# Patient Record
Sex: Male | Born: 1950 | Race: Black or African American | Hispanic: No | Marital: Single | Smoking: Current every day smoker
Health system: Southern US, Community
[De-identification: ages and names within clinical notes are randomized; demographics above are authoritative.]

## PROBLEM LIST (undated history)

## (undated) DIAGNOSIS — I2510 Atherosclerotic heart disease of native coronary artery without angina pectoris: Secondary | ICD-10-CM

## (undated) DIAGNOSIS — I499 Cardiac arrhythmia, unspecified: Secondary | ICD-10-CM

## (undated) DIAGNOSIS — N189 Chronic kidney disease, unspecified: Secondary | ICD-10-CM

## (undated) DIAGNOSIS — B029 Zoster without complications: Secondary | ICD-10-CM

## (undated) DIAGNOSIS — Z8701 Personal history of pneumonia (recurrent): Secondary | ICD-10-CM

## (undated) DIAGNOSIS — K859 Acute pancreatitis without necrosis or infection, unspecified: Secondary | ICD-10-CM

## (undated) DIAGNOSIS — D649 Anemia, unspecified: Secondary | ICD-10-CM

## (undated) DIAGNOSIS — N2581 Secondary hyperparathyroidism of renal origin: Secondary | ICD-10-CM

## (undated) DIAGNOSIS — K219 Gastro-esophageal reflux disease without esophagitis: Secondary | ICD-10-CM

## (undated) DIAGNOSIS — D696 Thrombocytopenia, unspecified: Secondary | ICD-10-CM

## (undated) DIAGNOSIS — E119 Type 2 diabetes mellitus without complications: Secondary | ICD-10-CM

## (undated) DIAGNOSIS — I10 Essential (primary) hypertension: Secondary | ICD-10-CM

## (undated) DIAGNOSIS — M1990 Unspecified osteoarthritis, unspecified site: Secondary | ICD-10-CM

## (undated) DIAGNOSIS — E785 Hyperlipidemia, unspecified: Secondary | ICD-10-CM

## (undated) DIAGNOSIS — J449 Chronic obstructive pulmonary disease, unspecified: Secondary | ICD-10-CM

## (undated) HISTORY — PX: APPENDECTOMY: SHX54

## (undated) HISTORY — PX: EYE SURGERY: SHX253

## (undated) HISTORY — DX: Acute pancreatitis without necrosis or infection, unspecified: K85.90

## (undated) HISTORY — PX: CORONARY ANGIOPLASTY: SHX604

## (undated) HISTORY — PX: CORONARY ARTERY BYPASS GRAFT: SHX141

## (undated) HISTORY — PX: TRANSPLANTATION RENAL: SUR1385

---

## 2002-02-20 ENCOUNTER — Inpatient Hospital Stay (HOSPITAL_COMMUNITY): Admission: AD | Admit: 2002-02-20 | Discharge: 2002-02-28 | Payer: Self-pay | Admitting: Nephrology

## 2002-02-22 ENCOUNTER — Encounter: Payer: Self-pay | Admitting: Nephrology

## 2002-02-23 ENCOUNTER — Encounter: Payer: Self-pay | Admitting: Nephrology

## 2002-02-24 ENCOUNTER — Encounter: Payer: Self-pay | Admitting: Nephrology

## 2004-09-10 ENCOUNTER — Inpatient Hospital Stay (HOSPITAL_COMMUNITY): Admission: AD | Admit: 2004-09-10 | Discharge: 2004-09-12 | Payer: Self-pay | Admitting: Nephrology

## 2009-12-23 ENCOUNTER — Emergency Department: Payer: Self-pay | Admitting: Emergency Medicine

## 2010-02-09 DIAGNOSIS — I2510 Atherosclerotic heart disease of native coronary artery without angina pectoris: Secondary | ICD-10-CM

## 2010-02-09 HISTORY — DX: Atherosclerotic heart disease of native coronary artery without angina pectoris: I25.10

## 2010-05-12 ENCOUNTER — Emergency Department: Payer: Self-pay | Admitting: Emergency Medicine

## 2010-11-10 ENCOUNTER — Ambulatory Visit: Payer: Self-pay | Admitting: Oncology

## 2010-12-02 ENCOUNTER — Ambulatory Visit: Payer: Self-pay | Admitting: Oncology

## 2011-02-24 ENCOUNTER — Ambulatory Visit: Payer: Self-pay | Admitting: Ophthalmology

## 2011-03-10 ENCOUNTER — Ambulatory Visit: Payer: Self-pay | Admitting: Ophthalmology

## 2011-03-10 LAB — POTASSIUM: Potassium: 4.9 mmol/L (ref 3.5–5.1)

## 2011-04-20 ENCOUNTER — Inpatient Hospital Stay: Payer: Self-pay | Admitting: *Deleted

## 2011-04-20 LAB — COMPREHENSIVE METABOLIC PANEL
Albumin: 3.5 g/dL (ref 3.4–5.0)
Anion Gap: 13 (ref 7–16)
BUN: 20 mg/dL — ABNORMAL HIGH (ref 7–18)
Bilirubin,Total: 0.5 mg/dL (ref 0.2–1.0)
Calcium, Total: 8.6 mg/dL (ref 8.5–10.1)
Chloride: 99 mmol/L (ref 98–107)
Creatinine: 4.22 mg/dL — ABNORMAL HIGH (ref 0.60–1.30)
EGFR (African American): 19 — ABNORMAL LOW
Glucose: 72 mg/dL (ref 65–99)
Osmolality: 281 (ref 275–301)
Potassium: 3.9 mmol/L (ref 3.5–5.1)
Sodium: 140 mmol/L (ref 136–145)
Total Protein: 7.3 g/dL (ref 6.4–8.2)

## 2011-04-20 LAB — CBC
MCH: 31.3 pg (ref 26.0–34.0)
MCHC: 33.1 g/dL (ref 32.0–36.0)
Platelet: 109 10*3/uL — ABNORMAL LOW (ref 150–440)
RBC: 3.33 10*6/uL — ABNORMAL LOW (ref 4.40–5.90)
RDW: 16.7 % — ABNORMAL HIGH (ref 11.5–14.5)

## 2011-04-20 LAB — CK TOTAL AND CKMB (NOT AT ARMC): CK-MB: 8.7 ng/mL — ABNORMAL HIGH (ref 0.5–3.6)

## 2011-04-20 LAB — TROPONIN I: Troponin-I: 0.09 ng/mL — ABNORMAL HIGH

## 2011-04-20 LAB — PROTIME-INR
INR: 1
Prothrombin Time: 14.2 secs (ref 11.5–14.7)

## 2011-04-21 LAB — BASIC METABOLIC PANEL
Anion Gap: 17 — ABNORMAL HIGH (ref 7–16)
Calcium, Total: 9.2 mg/dL (ref 8.5–10.1)
Chloride: 100 mmol/L (ref 98–107)
Co2: 26 mmol/L (ref 21–32)
Creatinine: 5.9 mg/dL — ABNORMAL HIGH (ref 0.60–1.30)
EGFR (African American): 13 — ABNORMAL LOW
EGFR (Non-African Amer.): 10 — ABNORMAL LOW
Osmolality: 294 (ref 275–301)
Sodium: 143 mmol/L (ref 136–145)

## 2011-04-21 LAB — CBC WITH DIFFERENTIAL/PLATELET
Basophil #: 0 10*3/uL (ref 0.0–0.1)
Basophil %: 0.2 %
Eosinophil #: 0 10*3/uL (ref 0.0–0.7)
Eosinophil %: 0.1 %
HCT: 30.5 % — ABNORMAL LOW (ref 40.0–52.0)
HGB: 10.1 g/dL — ABNORMAL LOW (ref 13.0–18.0)
Lymphocyte #: 0.3 10*3/uL — ABNORMAL LOW (ref 1.0–3.6)
MCHC: 33 g/dL (ref 32.0–36.0)
MCV: 95 fL (ref 80–100)
Monocyte #: 0.1 10*3/uL (ref 0.0–0.7)
Monocyte %: 1.4 %
Neutrophil %: 93.3 %
Platelet: 107 10*3/uL — ABNORMAL LOW (ref 150–440)
WBC: 5.2 10*3/uL (ref 3.8–10.6)

## 2011-04-21 LAB — CK TOTAL AND CKMB (NOT AT ARMC)
CK, Total: 244 U/L — ABNORMAL HIGH (ref 35–232)
CK, Total: 313 U/L — ABNORMAL HIGH (ref 35–232)
CK-MB: 6.9 ng/mL — ABNORMAL HIGH (ref 0.5–3.6)
CK-MB: 7.4 ng/mL — ABNORMAL HIGH (ref 0.5–3.6)

## 2011-04-21 LAB — LIPID PANEL
HDL Cholesterol: 97 mg/dL — ABNORMAL HIGH (ref 40–60)
Ldl Cholesterol, Calc: 109 mg/dL — ABNORMAL HIGH (ref 0–100)
Triglycerides: 67 mg/dL (ref 0–200)
VLDL Cholesterol, Calc: 13 mg/dL (ref 5–40)

## 2011-04-21 LAB — TROPONIN I
Troponin-I: 0.09 ng/mL — ABNORMAL HIGH
Troponin-I: 0.09 ng/mL — ABNORMAL HIGH

## 2011-04-22 LAB — PROTIME-INR: INR: 1.3

## 2011-04-22 LAB — CBC WITH DIFFERENTIAL/PLATELET
Basophil %: 0 %
Eosinophil #: 0 10*3/uL (ref 0.0–0.7)
Eosinophil %: 0 %
HCT: 27.3 % — ABNORMAL LOW (ref 40.0–52.0)
Lymphocyte #: 0.4 10*3/uL — ABNORMAL LOW (ref 1.0–3.6)
Monocyte %: 3.2 %
Neutrophil %: 90.5 %
Platelet: 108 10*3/uL — ABNORMAL LOW (ref 150–440)
RBC: 2.89 10*6/uL — ABNORMAL LOW (ref 4.40–5.90)
WBC: 5.8 10*3/uL (ref 3.8–10.6)

## 2011-04-22 LAB — BASIC METABOLIC PANEL
BUN: 52 mg/dL — ABNORMAL HIGH (ref 7–18)
Calcium, Total: 8.8 mg/dL (ref 8.5–10.1)
Creatinine: 7.65 mg/dL — ABNORMAL HIGH (ref 0.60–1.30)
EGFR (African American): 9 — ABNORMAL LOW
EGFR (Non-African Amer.): 8 — ABNORMAL LOW
Glucose: 135 mg/dL — ABNORMAL HIGH (ref 65–99)

## 2011-04-22 LAB — APTT: Activated PTT: 114.6 secs — ABNORMAL HIGH (ref 23.6–35.9)

## 2011-04-23 LAB — CBC WITH DIFFERENTIAL/PLATELET
Basophil #: 0 10*3/uL (ref 0.0–0.1)
Basophil %: 0.3 %
Eosinophil %: 0.6 %
HCT: 27.6 % — ABNORMAL LOW (ref 40.0–52.0)
Lymphocyte #: 0.5 10*3/uL — ABNORMAL LOW (ref 1.0–3.6)
Lymphocyte %: 10.7 %
MCH: 31.1 pg (ref 26.0–34.0)
MCV: 95 fL (ref 80–100)
Monocyte #: 0.5 10*3/uL (ref 0.0–0.7)
Monocyte %: 9.1 %
Neutrophil #: 4 10*3/uL (ref 1.4–6.5)
RBC: 2.91 10*6/uL — ABNORMAL LOW (ref 4.40–5.90)
RDW: 17.5 % — ABNORMAL HIGH (ref 11.5–14.5)
WBC: 5.1 10*3/uL (ref 3.8–10.6)

## 2011-04-23 LAB — PROTIME-INR
INR: 1.5
Prothrombin Time: 18.6 secs — ABNORMAL HIGH (ref 11.5–14.7)

## 2011-04-23 LAB — BASIC METABOLIC PANEL
BUN: 42 mg/dL — ABNORMAL HIGH (ref 7–18)
Calcium, Total: 8.1 mg/dL — ABNORMAL LOW (ref 8.5–10.1)
Creatinine: 5.97 mg/dL — ABNORMAL HIGH (ref 0.60–1.30)
EGFR (African American): 12 — ABNORMAL LOW
EGFR (Non-African Amer.): 10 — ABNORMAL LOW
Glucose: 113 mg/dL — ABNORMAL HIGH (ref 65–99)
Osmolality: 291 (ref 275–301)
Potassium: 4.2 mmol/L (ref 3.5–5.1)
Sodium: 140 mmol/L (ref 136–145)

## 2011-04-23 LAB — APTT: Activated PTT: 100.8 secs — ABNORMAL HIGH (ref 23.6–35.9)

## 2011-04-24 LAB — APTT
Activated PTT: 117.5 secs — ABNORMAL HIGH (ref 23.6–35.9)
Activated PTT: 99.8 secs — ABNORMAL HIGH (ref 23.6–35.9)

## 2011-04-24 LAB — RENAL FUNCTION PANEL
Albumin: 3.1 g/dL — ABNORMAL LOW (ref 3.4–5.0)
Anion Gap: 17 — ABNORMAL HIGH (ref 7–16)
Chloride: 95 mmol/L — ABNORMAL LOW (ref 98–107)
Co2: 26 mmol/L (ref 21–32)
Creatinine: 8.05 mg/dL — ABNORMAL HIGH (ref 0.60–1.30)
EGFR (African American): 9 — ABNORMAL LOW
EGFR (Non-African Amer.): 7 — ABNORMAL LOW
Potassium: 3.8 mmol/L (ref 3.5–5.1)
Sodium: 138 mmol/L (ref 136–145)

## 2011-04-24 LAB — PROTIME-INR: Prothrombin Time: 20.3 secs — ABNORMAL HIGH (ref 11.5–14.7)

## 2011-04-25 LAB — PROTIME-INR
INR: 1.8
Prothrombin Time: 20.8 secs — ABNORMAL HIGH (ref 11.5–14.7)

## 2011-04-25 LAB — BASIC METABOLIC PANEL
BUN: 36 mg/dL — ABNORMAL HIGH (ref 7–18)
EGFR (African American): 12 — ABNORMAL LOW
EGFR (Non-African Amer.): 10 — ABNORMAL LOW
Glucose: 176 mg/dL — ABNORMAL HIGH (ref 65–99)
Osmolality: 290 (ref 275–301)

## 2011-04-25 LAB — APTT
Activated PTT: >160 secs (ref 23.6–35.9)
Activated PTT: 89.5 secs — ABNORMAL HIGH (ref 23.6–35.9)

## 2011-04-26 LAB — CULTURE, BLOOD (SINGLE)

## 2011-04-26 LAB — HEMOGLOBIN: HGB: 10.1 g/dL — ABNORMAL LOW (ref 13.0–18.0)

## 2011-04-26 LAB — APTT: Activated PTT: 74.5 secs — ABNORMAL HIGH (ref 23.6–35.9)

## 2011-04-26 LAB — PLATELET COUNT: Platelet: 158 10*3/uL (ref 150–440)

## 2011-06-01 ENCOUNTER — Inpatient Hospital Stay: Payer: Self-pay | Admitting: Specialist

## 2011-06-01 LAB — CK TOTAL AND CKMB (NOT AT ARMC)
CK, Total: 253 U/L — ABNORMAL HIGH (ref 35–232)
CK, Total: 226 U/L (ref 35–232)
CK-MB: 8.5 ng/mL — ABNORMAL HIGH (ref 0.5–3.6)
CK-MB: 8 ng/mL — ABNORMAL HIGH (ref 0.5–3.6)

## 2011-06-01 LAB — COMPREHENSIVE METABOLIC PANEL
Alkaline Phosphatase: 77 U/L (ref 50–136)
Anion Gap: 10 (ref 7–16)
Bilirubin,Total: 0.5 mg/dL (ref 0.2–1.0)
Calcium, Total: 8.6 mg/dL (ref 8.5–10.1)
Chloride: 105 mmol/L (ref 98–107)
EGFR (African American): 11 — ABNORMAL LOW
Glucose: 73 mg/dL (ref 65–99)
Osmolality: 285 (ref 275–301)
Potassium: 3.7 mmol/L (ref 3.5–5.1)
SGOT(AST): 30 U/L (ref 15–37)
SGPT (ALT): 15 U/L
Sodium: 140 mmol/L (ref 136–145)
Total Protein: 7.1 g/dL (ref 6.4–8.2)

## 2011-06-01 LAB — CBC
MCHC: 32.5 g/dL (ref 32.0–36.0)
MCV: 96 fL (ref 80–100)
WBC: 3.2 10*3/uL — ABNORMAL LOW (ref 3.8–10.6)

## 2011-06-01 LAB — PROTIME-INR
INR: 1
Prothrombin Time: 13.3 secs (ref 11.5–14.7)

## 2011-06-01 LAB — TROPONIN I: Troponin-I: 0.16 ng/mL — ABNORMAL HIGH

## 2011-06-02 LAB — CBC WITH DIFFERENTIAL/PLATELET
Basophil #: 0 10*3/uL (ref 0.0–0.1)
Basophil %: 1.5 %
Eosinophil #: 0.1 10*3/uL (ref 0.0–0.7)
HCT: 32.2 % — ABNORMAL LOW (ref 40.0–52.0)
Lymphocyte #: 0.6 10*3/uL — ABNORMAL LOW (ref 1.0–3.6)
Monocyte #: 0.5 x10 3/mm (ref 0.2–1.0)
Monocyte %: 16.6 %
Neutrophil #: 1.9 10*3/uL (ref 1.4–6.5)
Neutrophil %: 59.3 %
RDW: 17.3 % — ABNORMAL HIGH (ref 11.5–14.5)

## 2011-06-02 LAB — PROTIME-INR: INR: 1

## 2011-06-02 LAB — TROPONIN I: Troponin-I: 0.2 ng/mL — ABNORMAL HIGH

## 2011-06-02 LAB — CK TOTAL AND CKMB (NOT AT ARMC): CK, Total: 225 U/L (ref 35–232)

## 2011-06-04 LAB — PROTIME-INR: INR: 1.1

## 2011-06-05 LAB — RENAL FUNCTION PANEL
Albumin: 3.3 g/dL — ABNORMAL LOW (ref 3.4–5.0)
Anion Gap: 14 (ref 7–16)
Co2: 26 mmol/L (ref 21–32)
EGFR (African American): 8 — ABNORMAL LOW
EGFR (Non-African Amer.): 7 — ABNORMAL LOW
Glucose: 83 mg/dL (ref 65–99)
Osmolality: 276 (ref 275–301)
Phosphorus: 5.9 mg/dL — ABNORMAL HIGH (ref 2.5–4.9)
Sodium: 133 mmol/L — ABNORMAL LOW (ref 136–145)

## 2011-06-05 LAB — CBC WITH DIFFERENTIAL/PLATELET
Basophil #: 0 10*3/uL (ref 0.0–0.1)
Eosinophil %: 3.8 %
HCT: 29.4 % — ABNORMAL LOW (ref 40.0–52.0)
HGB: 9.5 g/dL — ABNORMAL LOW (ref 13.0–18.0)
Monocyte #: 0.6 x10 3/mm (ref 0.2–1.0)
Monocyte %: 16.5 %
Neutrophil #: 2.4 10*3/uL (ref 1.4–6.5)
RBC: 3.05 10*6/uL — ABNORMAL LOW (ref 4.40–5.90)
WBC: 3.8 10*3/uL (ref 3.8–10.6)

## 2011-06-05 LAB — PROTIME-INR: INR: 1.4

## 2011-06-06 LAB — PROTIME-INR
INR: 1.5
Prothrombin Time: 18.8 secs — ABNORMAL HIGH (ref 11.5–14.7)

## 2011-06-10 ENCOUNTER — Inpatient Hospital Stay: Payer: Self-pay | Admitting: Specialist

## 2011-06-10 LAB — CK TOTAL AND CKMB (NOT AT ARMC)
CK, Total: 193 U/L (ref 35–232)
CK-MB: 6 ng/mL — ABNORMAL HIGH (ref 0.5–3.6)
CK-MB: 8.1 ng/mL — ABNORMAL HIGH (ref 0.5–3.6)

## 2011-06-10 LAB — TROPONIN I
Troponin-I: 0.03 ng/mL
Troponin-I: 2.02 ng/mL — ABNORMAL HIGH

## 2011-06-10 LAB — COMPREHENSIVE METABOLIC PANEL
Albumin: 3.2 g/dL — ABNORMAL LOW (ref 3.4–5.0)
Alkaline Phosphatase: 84 U/L (ref 50–136)
Anion Gap: 9 (ref 7–16)
BUN: 14 mg/dL (ref 7–18)
Bilirubin,Total: 0.4 mg/dL (ref 0.2–1.0)
Calcium, Total: 8.4 mg/dL — ABNORMAL LOW (ref 8.5–10.1)
Chloride: 98 mmol/L (ref 98–107)
Co2: 30 mmol/L (ref 21–32)
Creatinine: 2.91 mg/dL — ABNORMAL HIGH (ref 0.60–1.30)
EGFR (African American): 26 — ABNORMAL LOW
EGFR (Non-African Amer.): 22 — ABNORMAL LOW
Glucose: 75 mg/dL (ref 65–99)
Potassium: 3.6 mmol/L (ref 3.5–5.1)
Sodium: 137 mmol/L (ref 136–145)
Total Protein: 6.6 g/dL (ref 6.4–8.2)

## 2011-06-10 LAB — PROTIME-INR
INR: 1.6
Prothrombin Time: 19.1 secs — ABNORMAL HIGH (ref 11.5–14.7)

## 2011-06-10 LAB — CBC
HCT: 30.5 % — ABNORMAL LOW (ref 40.0–52.0)
MCH: 32 pg (ref 26.0–34.0)
MCHC: 33.5 g/dL (ref 32.0–36.0)
Platelet: 133 10*3/uL — ABNORMAL LOW (ref 150–440)
RBC: 3.19 10*6/uL — ABNORMAL LOW (ref 4.40–5.90)
RDW: 16.2 % — ABNORMAL HIGH (ref 11.5–14.5)
WBC: 3.1 10*3/uL — ABNORMAL LOW (ref 3.8–10.6)

## 2011-06-11 LAB — CK TOTAL AND CKMB (NOT AT ARMC)
CK, Total: 122 U/L (ref 35–232)
CK-MB: 4.8 ng/mL — ABNORMAL HIGH (ref 0.5–3.6)

## 2011-06-11 LAB — PROTIME-INR
INR: 1.4
Prothrombin Time: 17.5 secs — ABNORMAL HIGH (ref 11.5–14.7)

## 2011-06-11 LAB — TROPONIN I: Troponin-I: 1.7 ng/mL — ABNORMAL HIGH

## 2011-07-09 ENCOUNTER — Ambulatory Visit: Payer: Self-pay | Admitting: Vascular Surgery

## 2011-07-09 LAB — POTASSIUM: Potassium: 4.2 mmol/L (ref 3.5–5.1)

## 2011-07-09 LAB — PROTIME-INR: Prothrombin Time: 13.6 secs (ref 11.5–14.7)

## 2011-09-04 ENCOUNTER — Inpatient Hospital Stay: Payer: Self-pay | Admitting: Internal Medicine

## 2011-09-04 LAB — CBC
HGB: 10.4 g/dL — ABNORMAL LOW (ref 13.0–18.0)
MCH: 32 pg (ref 26.0–34.0)
MCV: 94 fL (ref 80–100)
Platelet: 94 10*3/uL — ABNORMAL LOW (ref 150–440)
RBC: 3.25 10*6/uL — ABNORMAL LOW (ref 4.40–5.90)
WBC: 3 10*3/uL — ABNORMAL LOW (ref 3.8–10.6)

## 2011-09-04 LAB — CK TOTAL AND CKMB (NOT AT ARMC)
CK, Total: 271 U/L — ABNORMAL HIGH (ref 35–232)
CK-MB: 6.4 ng/mL — ABNORMAL HIGH (ref 0.5–3.6)

## 2011-09-04 LAB — COMPREHENSIVE METABOLIC PANEL
Albumin: 3.3 g/dL — ABNORMAL LOW (ref 3.4–5.0)
Alkaline Phosphatase: 84 U/L (ref 50–136)
BUN: 22 mg/dL — ABNORMAL HIGH (ref 7–18)
Calcium, Total: 8.1 mg/dL — ABNORMAL LOW (ref 8.5–10.1)
EGFR (African American): 15 — ABNORMAL LOW
EGFR (Non-African Amer.): 13 — ABNORMAL LOW
Glucose: 86 mg/dL (ref 65–99)
SGOT(AST): 25 U/L (ref 15–37)
Total Protein: 6.5 g/dL (ref 6.4–8.2)

## 2011-09-04 LAB — TROPONIN I: Troponin-I: 0.14 ng/mL — ABNORMAL HIGH

## 2011-09-05 LAB — CBC WITH DIFFERENTIAL/PLATELET
Eosinophil %: 0.3 %
Lymphocyte #: 0.3 10*3/uL — ABNORMAL LOW (ref 1.0–3.6)
MCH: 31.7 pg (ref 26.0–34.0)
MCHC: 33.3 g/dL (ref 32.0–36.0)
MCV: 95 fL (ref 80–100)
Monocyte #: 0.3 x10 3/mm (ref 0.2–1.0)
Neutrophil #: 3.1 10*3/uL (ref 1.4–6.5)
Platelet: 96 10*3/uL — ABNORMAL LOW (ref 150–440)
RBC: 3.33 10*6/uL — ABNORMAL LOW (ref 4.40–5.90)

## 2011-09-05 LAB — BASIC METABOLIC PANEL
BUN: 30 mg/dL — ABNORMAL HIGH (ref 7–18)
Calcium, Total: 8.8 mg/dL (ref 8.5–10.1)
Creatinine: 6.28 mg/dL — ABNORMAL HIGH (ref 0.60–1.30)
EGFR (African American): 10 — ABNORMAL LOW
EGFR (Non-African Amer.): 9 — ABNORMAL LOW
Glucose: 122 mg/dL — ABNORMAL HIGH (ref 65–99)
Sodium: 141 mmol/L (ref 136–145)

## 2011-09-05 LAB — TROPONIN I: Troponin-I: 0.29 ng/mL — ABNORMAL HIGH

## 2011-09-06 LAB — BASIC METABOLIC PANEL
Calcium, Total: 9.2 mg/dL (ref 8.5–10.1)
Chloride: 99 mmol/L (ref 98–107)
Co2: 32 mmol/L (ref 21–32)
EGFR (Non-African Amer.): 11 — ABNORMAL LOW
Glucose: 91 mg/dL (ref 65–99)
Osmolality: 279 (ref 275–301)
Potassium: 4.7 mmol/L (ref 3.5–5.1)
Sodium: 139 mmol/L (ref 136–145)

## 2011-09-06 LAB — CBC WITH DIFFERENTIAL/PLATELET
Basophil #: 0.1 10*3/uL (ref 0.0–0.1)
HCT: 32.5 % — ABNORMAL LOW (ref 40.0–52.0)
Lymphocyte #: 0.4 10*3/uL — ABNORMAL LOW (ref 1.0–3.6)
MCH: 31.6 pg (ref 26.0–34.0)
MCHC: 33 g/dL (ref 32.0–36.0)
MCV: 96 fL (ref 80–100)
Monocyte #: 0.5 x10 3/mm (ref 0.2–1.0)
Monocyte %: 12.4 %
Neutrophil #: 2.8 10*3/uL (ref 1.4–6.5)
Platelet: 100 10*3/uL — ABNORMAL LOW (ref 150–440)
RDW: 16.3 % — ABNORMAL HIGH (ref 11.5–14.5)
WBC: 3.8 10*3/uL (ref 3.8–10.6)

## 2011-09-07 LAB — CBC WITH DIFFERENTIAL/PLATELET
Basophil #: 0 10*3/uL (ref 0.0–0.1)
Basophil %: 0.3 %
Eosinophil #: 0 10*3/uL (ref 0.0–0.7)
HCT: 31.5 % — ABNORMAL LOW (ref 40.0–52.0)
HGB: 10.7 g/dL — ABNORMAL LOW (ref 13.0–18.0)
MCH: 32.3 pg (ref 26.0–34.0)
MCHC: 34.1 g/dL (ref 32.0–36.0)
Monocyte #: 0.2 x10 3/mm (ref 0.2–1.0)
Neutrophil #: 5.7 10*3/uL (ref 1.4–6.5)
Neutrophil %: 92.5 %
RDW: 16.4 % — ABNORMAL HIGH (ref 11.5–14.5)

## 2011-09-07 LAB — RENAL FUNCTION PANEL
Anion Gap: 12 (ref 7–16)
Creatinine: 7.78 mg/dL — ABNORMAL HIGH (ref 0.60–1.30)
EGFR (African American): 8 — ABNORMAL LOW
Glucose: 113 mg/dL — ABNORMAL HIGH (ref 65–99)
Osmolality: 286 (ref 275–301)
Potassium: 4.9 mmol/L (ref 3.5–5.1)
Sodium: 137 mmol/L (ref 136–145)

## 2011-09-09 LAB — PATHOLOGY REPORT

## 2011-09-09 LAB — PHOSPHORUS: Phosphorus: 4.9 mg/dL (ref 2.5–4.9)

## 2011-09-11 ENCOUNTER — Emergency Department: Payer: Self-pay | Admitting: Unknown Physician Specialty

## 2011-09-11 LAB — BASIC METABOLIC PANEL
Anion Gap: 12 (ref 7–16)
BUN: 37 mg/dL — ABNORMAL HIGH (ref 7–18)
Chloride: 101 mmol/L (ref 98–107)
Co2: 28 mmol/L (ref 21–32)
Creatinine: 4.3 mg/dL — ABNORMAL HIGH (ref 0.60–1.30)
Osmolality: 289 (ref 275–301)
Potassium: 3.9 mmol/L (ref 3.5–5.1)

## 2011-09-11 LAB — CBC
HGB: 11.6 g/dL — ABNORMAL LOW (ref 13.0–18.0)
MCH: 31.8 pg (ref 26.0–34.0)
RBC: 3.65 10*6/uL — ABNORMAL LOW (ref 4.40–5.90)
WBC: 5.6 10*3/uL (ref 3.8–10.6)

## 2011-09-11 LAB — HEPATIC FUNCTION PANEL A (ARMC)
Bilirubin, Direct: <0.1 mg/dL (ref 0.00–0.20)
Bilirubin,Total: 0.4 mg/dL (ref 0.2–1.0)
SGOT(AST): 22 U/L (ref 15–37)
Total Protein: 6 g/dL — ABNORMAL LOW (ref 6.4–8.2)

## 2011-10-26 ENCOUNTER — Emergency Department: Payer: Self-pay | Admitting: Emergency Medicine

## 2011-10-26 LAB — COMPREHENSIVE METABOLIC PANEL
Anion Gap: 12 (ref 7–16)
BUN: 37 mg/dL — ABNORMAL HIGH (ref 7–18)
Bilirubin,Total: 0.5 mg/dL (ref 0.2–1.0)
Chloride: 105 mmol/L (ref 98–107)
Creatinine: 5.69 mg/dL — ABNORMAL HIGH (ref 0.60–1.30)
EGFR (African American): 11 — ABNORMAL LOW
EGFR (Non-African Amer.): 10 — ABNORMAL LOW
Potassium: 3.6 mmol/L (ref 3.5–5.1)
Sodium: 143 mmol/L (ref 136–145)
Total Protein: 6.8 g/dL (ref 6.4–8.2)

## 2011-10-26 LAB — CBC WITH DIFFERENTIAL/PLATELET
Lymphocyte #: 0.4 10*3/uL — ABNORMAL LOW (ref 1.0–3.6)
Lymphocyte %: 11 %
MCHC: 33.6 g/dL (ref 32.0–36.0)
Neutrophil #: 2.9 10*3/uL (ref 1.4–6.5)
Neutrophil %: 72.9 %
RDW: 17.4 % — ABNORMAL HIGH (ref 11.5–14.5)

## 2011-10-28 ENCOUNTER — Inpatient Hospital Stay: Payer: Self-pay | Admitting: Internal Medicine

## 2011-10-28 LAB — CBC
MCH: 31.6 pg (ref 26.0–34.0)
MCHC: 32.8 g/dL (ref 32.0–36.0)
Platelet: 92 10*3/uL — ABNORMAL LOW (ref 150–440)
RDW: 16.9 % — ABNORMAL HIGH (ref 11.5–14.5)

## 2011-10-28 LAB — MAGNESIUM: Magnesium: 1.9 mg/dL

## 2011-10-28 LAB — COMPREHENSIVE METABOLIC PANEL
Anion Gap: 10 (ref 7–16)
BUN: 9 mg/dL (ref 7–18)
Bilirubin,Total: 0.5 mg/dL (ref 0.2–1.0)
Chloride: 105 mmol/L (ref 98–107)
Creatinine: 2.27 mg/dL — ABNORMAL HIGH (ref 0.60–1.30)
EGFR (African American): 35 — ABNORMAL LOW
Potassium: 2.8 mmol/L — ABNORMAL LOW (ref 3.5–5.1)
Total Protein: 6 g/dL — ABNORMAL LOW (ref 6.4–8.2)

## 2011-10-28 LAB — PROTIME-INR: INR: 1.1

## 2011-10-29 LAB — CBC WITH DIFFERENTIAL/PLATELET
Basophil #: 0.1 10*3/uL (ref 0.0–0.1)
Basophil %: 1.9 %
Eosinophil #: 0.2 10*3/uL (ref 0.0–0.7)
Eosinophil %: 4.1 %
HCT: 24.8 % — ABNORMAL LOW (ref 40.0–52.0)
HGB: 8.1 g/dL — ABNORMAL LOW (ref 13.0–18.0)
Lymphocyte #: 0.4 10*3/uL — ABNORMAL LOW (ref 1.0–3.6)
Lymphocyte #: 0.5 10*3/uL — ABNORMAL LOW (ref 1.0–3.6)
MCHC: 32.6 g/dL (ref 32.0–36.0)
MCV: 98 fL (ref 80–100)
Monocyte #: 0.5 x10 3/mm (ref 0.2–1.0)
Monocyte #: 0.6 x10 3/mm (ref 0.2–1.0)
Monocyte %: 12.6 %
Neutrophil #: 2.7 10*3/uL (ref 1.4–6.5)
Neutrophil #: 2.7 10*3/uL (ref 1.4–6.5)
Neutrophil %: 70.6 %
Neutrophil %: 67.7 %
Platelet: 107 10*3/uL — ABNORMAL LOW (ref 150–440)
Platelet: 95 10*3/uL — ABNORMAL LOW (ref 150–440)
RBC: 2.8 10*6/uL — ABNORMAL LOW (ref 4.40–5.90)
RDW: 17.3 % — ABNORMAL HIGH (ref 11.5–14.5)
WBC: 3.9 10*3/uL (ref 3.8–10.6)
WBC: 4 10*3/uL (ref 3.8–10.6)

## 2011-10-29 LAB — LIPID PANEL
Cholesterol: 214 mg/dL — ABNORMAL HIGH (ref 0–200)
Triglycerides: 71 mg/dL (ref 0–200)
VLDL Cholesterol, Calc: 14 mg/dL (ref 5–40)

## 2011-10-29 LAB — HEMOGLOBIN A1C: Hemoglobin A1C: <3.5 % — ABNORMAL LOW (ref 4.2–6.3)

## 2011-10-29 LAB — BASIC METABOLIC PANEL
Chloride: 103 mmol/L (ref 98–107)
Co2: 29 mmol/L (ref 21–32)
EGFR (African American): 15 — ABNORMAL LOW
EGFR (Non-African Amer.): 13 — ABNORMAL LOW
Glucose: 111 mg/dL — ABNORMAL HIGH (ref 65–99)
Potassium: 4.3 mmol/L (ref 3.5–5.1)
Sodium: 141 mmol/L (ref 136–145)

## 2011-10-29 LAB — URINALYSIS, COMPLETE
Glucose,UR: 150 mg/dL (ref 0–75)
Leukocyte Esterase: NEGATIVE
Nitrite: NEGATIVE
RBC,UR: 3 /HPF (ref 0–5)
Specific Gravity: 1.02 (ref 1.003–1.030)
WBC UR: 14 /HPF (ref 0–5)

## 2011-10-29 LAB — APTT
Activated PTT: 33.9 secs (ref 23.6–35.9)
Activated PTT: >160 secs (ref 23.6–35.9)

## 2011-10-29 LAB — MAGNESIUM: Magnesium: 2.2 mg/dL

## 2011-10-30 DIAGNOSIS — I369 Nonrheumatic tricuspid valve disorder, unspecified: Secondary | ICD-10-CM

## 2011-10-30 DIAGNOSIS — I214 Non-ST elevation (NSTEMI) myocardial infarction: Secondary | ICD-10-CM

## 2011-10-30 LAB — APTT
Activated PTT: 66.5 secs — ABNORMAL HIGH (ref 23.6–35.9)
Activated PTT: 62.9 secs — ABNORMAL HIGH (ref 23.6–35.9)

## 2011-10-31 LAB — CBC WITH DIFFERENTIAL/PLATELET
Basophil #: 0 10*3/uL (ref 0.0–0.1)
Basophil %: 0.3 %
Eosinophil #: 0 10*3/uL (ref 0.0–0.7)
HCT: 29.6 % — ABNORMAL LOW (ref 40.0–52.0)
Lymphocyte %: 5.9 %
MCH: 32.3 pg (ref 26.0–34.0)
MCHC: 34 g/dL (ref 32.0–36.0)
Monocyte #: 0.6 x10 3/mm (ref 0.2–1.0)
Neutrophil #: 5.3 10*3/uL (ref 1.4–6.5)
Neutrophil %: 84.3 %
Platelet: 107 10*3/uL — ABNORMAL LOW (ref 150–440)
RDW: 17.2 % — ABNORMAL HIGH (ref 11.5–14.5)
WBC: 6.3 10*3/uL (ref 3.8–10.6)

## 2011-10-31 LAB — APTT: Activated PTT: 69.5 secs — ABNORMAL HIGH (ref 23.6–35.9)

## 2011-11-01 DIAGNOSIS — I200 Unstable angina: Secondary | ICD-10-CM

## 2011-11-01 LAB — TROPONIN I: Troponin-I: 0.6 ng/mL — ABNORMAL HIGH

## 2011-11-02 LAB — CBC WITH DIFFERENTIAL/PLATELET
Basophil #: 0 10*3/uL (ref 0.0–0.1)
Eosinophil #: 0 10*3/uL (ref 0.0–0.7)
HCT: 32.9 % — ABNORMAL LOW (ref 40.0–52.0)
HGB: 11.5 g/dL — ABNORMAL LOW (ref 13.0–18.0)
Lymphocyte %: 4.4 %
MCH: 33.6 pg (ref 26.0–34.0)
MCHC: 35 g/dL (ref 32.0–36.0)
Monocyte #: 0.6 x10 3/mm (ref 0.2–1.0)
Neutrophil %: 87 %
Platelet: 146 10*3/uL — ABNORMAL LOW (ref 150–440)
RBC: 3.43 10*6/uL — ABNORMAL LOW (ref 4.40–5.90)
RDW: 17.5 % — ABNORMAL HIGH (ref 11.5–14.5)

## 2011-11-02 LAB — CK TOTAL AND CKMB (NOT AT ARMC): CK-MB: 3.6 ng/mL (ref 0.5–3.6)

## 2011-11-02 LAB — APTT: Activated PTT: 78.9 secs — ABNORMAL HIGH (ref 23.6–35.9)

## 2011-11-02 LAB — TROPONIN I: Troponin-I: 0.56 ng/mL — ABNORMAL HIGH

## 2011-11-03 ENCOUNTER — Encounter (HOSPITAL_COMMUNITY): Payer: Self-pay | Admitting: *Deleted

## 2011-11-03 ENCOUNTER — Inpatient Hospital Stay (HOSPITAL_COMMUNITY)
Admission: RE | Admit: 2011-11-03 | Discharge: 2011-11-05 | DRG: 246 | Disposition: A | Discharge status: Home / Self Care | Payer: Medicare Other | Source: Ambulatory Visit | Attending: Cardiovascular Disease | Admitting: Cardiovascular Disease

## 2011-11-03 DIAGNOSIS — N186 End stage renal disease: Secondary | ICD-10-CM | POA: Diagnosis present

## 2011-11-03 DIAGNOSIS — J449 Chronic obstructive pulmonary disease, unspecified: Secondary | ICD-10-CM

## 2011-11-03 DIAGNOSIS — I120 Hypertensive chronic kidney disease with stage 5 chronic kidney disease or end stage renal disease: Secondary | ICD-10-CM | POA: Diagnosis present

## 2011-11-03 DIAGNOSIS — E785 Hyperlipidemia, unspecified: Secondary | ICD-10-CM | POA: Diagnosis present

## 2011-11-03 DIAGNOSIS — Z7982 Long term (current) use of aspirin: Secondary | ICD-10-CM

## 2011-11-03 DIAGNOSIS — I4891 Unspecified atrial fibrillation: Secondary | ICD-10-CM | POA: Diagnosis present

## 2011-11-03 DIAGNOSIS — I252 Old myocardial infarction: Secondary | ICD-10-CM

## 2011-11-03 DIAGNOSIS — I214 Non-ST elevation (NSTEMI) myocardial infarction: Principal | ICD-10-CM | POA: Diagnosis present

## 2011-11-03 DIAGNOSIS — R5381 Other malaise: Secondary | ICD-10-CM | POA: Diagnosis present

## 2011-11-03 DIAGNOSIS — M19019 Primary osteoarthritis, unspecified shoulder: Secondary | ICD-10-CM | POA: Diagnosis present

## 2011-11-03 DIAGNOSIS — K219 Gastro-esophageal reflux disease without esophagitis: Secondary | ICD-10-CM | POA: Diagnosis present

## 2011-11-03 DIAGNOSIS — Z992 Dependence on renal dialysis: Secondary | ICD-10-CM

## 2011-11-03 DIAGNOSIS — D638 Anemia in other chronic diseases classified elsewhere: Secondary | ICD-10-CM | POA: Diagnosis present

## 2011-11-03 DIAGNOSIS — I2510 Atherosclerotic heart disease of native coronary artery without angina pectoris: Secondary | ICD-10-CM | POA: Diagnosis present

## 2011-11-03 DIAGNOSIS — Z951 Presence of aortocoronary bypass graft: Secondary | ICD-10-CM

## 2011-11-03 DIAGNOSIS — J4489 Other specified chronic obstructive pulmonary disease: Secondary | ICD-10-CM | POA: Diagnosis present

## 2011-11-03 DIAGNOSIS — D696 Thrombocytopenia, unspecified: Secondary | ICD-10-CM | POA: Diagnosis present

## 2011-11-03 DIAGNOSIS — R935 Abnormal findings on diagnostic imaging of other abdominal regions, including retroperitoneum: Secondary | ICD-10-CM

## 2011-11-03 DIAGNOSIS — E119 Type 2 diabetes mellitus without complications: Secondary | ICD-10-CM

## 2011-11-03 DIAGNOSIS — F17200 Nicotine dependence, unspecified, uncomplicated: Secondary | ICD-10-CM | POA: Diagnosis present

## 2011-11-03 DIAGNOSIS — Z79899 Other long term (current) drug therapy: Secondary | ICD-10-CM

## 2011-11-03 DIAGNOSIS — N2581 Secondary hyperparathyroidism of renal origin: Secondary | ICD-10-CM | POA: Diagnosis present

## 2011-11-03 DIAGNOSIS — Z9861 Coronary angioplasty status: Secondary | ICD-10-CM | POA: Diagnosis present

## 2011-11-03 DIAGNOSIS — IMO0002 Reserved for concepts with insufficient information to code with codable children: Secondary | ICD-10-CM

## 2011-11-03 DIAGNOSIS — I10 Essential (primary) hypertension: Secondary | ICD-10-CM | POA: Diagnosis present

## 2011-11-03 HISTORY — DX: Chronic kidney disease, unspecified: N18.9

## 2011-11-03 HISTORY — DX: Chronic obstructive pulmonary disease, unspecified: J44.9

## 2011-11-03 HISTORY — DX: Unspecified osteoarthritis, unspecified site: M19.90

## 2011-11-03 HISTORY — DX: Personal history of pneumonia (recurrent): Z87.01

## 2011-11-03 HISTORY — DX: Thrombocytopenia, unspecified: D69.6

## 2011-11-03 HISTORY — DX: Cardiac arrhythmia, unspecified: I49.9

## 2011-11-03 HISTORY — DX: Gastro-esophageal reflux disease without esophagitis: K21.9

## 2011-11-03 HISTORY — DX: Hyperlipidemia, unspecified: E78.5

## 2011-11-03 HISTORY — DX: Atherosclerotic heart disease of native coronary artery without angina pectoris: I25.10

## 2011-11-03 HISTORY — DX: Zoster without complications: B02.9

## 2011-11-03 HISTORY — DX: Type 2 diabetes mellitus without complications: E11.9

## 2011-11-03 HISTORY — DX: Secondary hyperparathyroidism of renal origin: N25.81

## 2011-11-03 HISTORY — DX: Anemia, unspecified: D64.9

## 2011-11-03 HISTORY — DX: Essential (primary) hypertension: I10

## 2011-11-03 LAB — CBC WITH DIFFERENTIAL/PLATELET
Basophils Absolute: 0 10*3/uL (ref 0.0–0.1)
Basophils Relative: 0 % (ref 0–1)
Eosinophils Absolute: 0 10*3/uL (ref 0.0–0.7)
Eosinophils Relative: 1 % (ref 0–5)
HCT: 34.3 % — ABNORMAL LOW (ref 39.0–52.0)
Hemoglobin: 11.3 g/dL — ABNORMAL LOW (ref 13.0–17.0)
Lymphocytes Relative: 6 % — ABNORMAL LOW (ref 12–46)
Lymphs Abs: 0.4 10*3/uL — ABNORMAL LOW (ref 0.7–4.0)
MCH: 31.7 pg (ref 26.0–34.0)
MCHC: 32.9 g/dL (ref 30.0–36.0)
MCV: 96.3 fL (ref 78.0–100.0)
Monocytes Absolute: 0.4 10*3/uL (ref 0.1–1.0)
Monocytes Relative: 6 % (ref 3–12)
Neutro Abs: 6.2 10*3/uL (ref 1.7–7.7)
Neutrophils Relative %: 88 % — ABNORMAL HIGH (ref 43–77)
Platelets: 148 10*3/uL — ABNORMAL LOW (ref 150–400)
RBC: 3.56 MIL/uL — ABNORMAL LOW (ref 4.22–5.81)
RDW: 17.2 % — ABNORMAL HIGH (ref 11.5–15.5)
WBC: 7.1 10*3/uL (ref 4.0–10.5)

## 2011-11-03 LAB — PROTIME-INR
INR: 0.99 (ref 0.00–1.49)
Prothrombin Time: 13 seconds (ref 11.6–15.2)

## 2011-11-03 MED ORDER — CLOPIDOGREL BISULFATE 75 MG PO TABS
75.0000 mg | ORAL_TABLET | Freq: Every day | ORAL | Status: DC
  Administered 2011-11-04 – 2011-11-05 (×2): 75 mg via ORAL
  Filled 2011-11-03 (×3): qty 1

## 2011-11-03 MED ORDER — SODIUM CHLORIDE 0.9 % IV SOLN
250.0000 mL | INTRAVENOUS | Status: DC | PRN
  Administered 2011-11-03: 250 mL via INTRAVENOUS

## 2011-11-03 MED ORDER — ONDANSETRON HCL 4 MG/2ML IJ SOLN: 4.0000 mg | Freq: Four times a day (QID) | INTRAMUSCULAR | Status: DC | PRN

## 2011-11-03 MED ORDER — ASPIRIN 81 MG PO CHEW
324.0000 mg | CHEWABLE_TABLET | ORAL | Status: AC
  Administered 2011-11-04: 324 mg via ORAL
  Filled 2011-11-03: qty 4

## 2011-11-03 MED ORDER — SODIUM CHLORIDE 0.9 % IJ SOLN: 3.0000 mL | INTRAMUSCULAR | Status: DC | PRN

## 2011-11-03 MED ORDER — SODIUM CHLORIDE 0.9 % IJ SOLN
3.0000 mL | Freq: Two times a day (BID) | INTRAMUSCULAR | Status: DC
  Administered 2011-11-03 – 2011-11-04 (×2): 3 mL via INTRAVENOUS

## 2011-11-03 MED ORDER — ACETAMINOPHEN 325 MG PO TABS
650.0000 mg | ORAL_TABLET | ORAL | Status: DC | PRN
  Administered 2011-11-04: 650 mg via ORAL
  Filled 2011-11-03: qty 2

## 2011-11-03 MED ORDER — ASPIRIN EC 81 MG PO TBEC
81.0000 mg | DELAYED_RELEASE_TABLET | Freq: Every day | ORAL | Status: DC
  Filled 2011-11-03: qty 1

## 2011-11-03 MED ORDER — NITROGLYCERIN 0.4 MG SL SUBL: 0.4000 mg | SUBLINGUAL_TABLET | SUBLINGUAL | Status: DC | PRN

## 2011-11-03 MED ORDER — NITROGLYCERIN IN D5W 200-5 MCG/ML-% IV SOLN
3.0000 ug/min | INTRAVENOUS | Status: DC
  Administered 2011-11-03: 70 ug/min via INTRAVENOUS
  Filled 2011-11-03: qty 250

## 2011-11-03 NOTE — H&P (Addendum)
Admit date: 11/03/2011 Referring Physician Dr. Katha Hamming Primary Cardiologist Dr. 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   He ruled in for small NSTEMI by cardiac enzymes.  He subsequently underwent cardiac cath showing 60% distal left main which is heavily calcified, patent LIMA to LAD, occluded prox LAD, 50% prox left circ, 99% mid left circ, 40% prox RCA, 60% and 99% mid RCA, 60% distal RCA with patent SVG to D1 and SVG to OM1 .  He was initially managed medically due to complexity of coronary anatomy but due to persistent chest pain when IV NTG was attempted to be weaned off he is now transferred here for PCI in the am by Dr. Kirke Corin.  Of note 2D echo showed normal LVF EF 55%, mildly dilated RV with normal RVF, mildly dilated LA/RA, mild MR, mod TR, mild to moderate AVSC with trivial AR and PR.  Currently he is pain free.    PMH:    Past Medical History  Diagnosis Date  . Hypertension   . Shortness of breath   . Pneumonia   . Anginal pain   . GERD (gastroesophageal reflux disease)   . Coronary artery disease 2012     S/P CABG with LIMA to LAD, SVG to D1, SVG to OM1  . Chronic kidney disease     ESRD on HD  . COPD (chronic obstructive pulmonary disease)   . Anemia     chronic disease  . Thrombocytopenia   . Secondary hyperparathyroidism of renal origin   . Dysrhythmia     atrial fibrillation  . Shingles   . Arthritis     right shoulder  . Myocardial infarction 10/2011, 06/2011    NSTEMI    PSH:    Past Surgical History  Procedure Date  . Coronary angioplasty   . Coronary artery bypass graft   . Appendectomy   . Eye surgery     cataract surgery  . Transplantation renal     Failed    ALLERGIES:    Statins  Prior to Admit Meds:   No prescriptions prior to admission   Family HX:   History reviewed. No pertinent family history. Social HX:    History   Social History  . Marital Status: Single    Spouse Name: N/A    Number of Children: N/A  . Years of Education: N/A   Occupational History  . Not on file.   Social History Main Topics  . Smoking status: Current Every Day Smoker -- 1.0 packs/day    Types: Cigarettes  . Smokeless tobacco: Not on file  . Alcohol Use: No  . Drug Use: No  . Sexually Active: Yes   Other Topics Concern  . Not on file   Social History Narrative  . No narrative on file     ROS:  All 11 ROS were addressed and are negative except what is stated in the HPI  PHYSICAL EXAM Filed Vitals:   11/03/11 2200  BP: 107/45  Pulse: 95  Temp:   Resp: 16   General: Well developed, well nourished, in no acute distress Head: Eyes PERRLA, No xanthomas.   Normal cephalic and atramatic  Lungs:   Scattered  wheezes and rhonchi Heart:   HRRR S1 S2 Pulses are 2+ & equal.            No carotid bruit. No JVD.  No abdominal bruits. No femoral bruits. Abdomen: Bowel sounds are positive, abdomen soft and non-tender without masses  Extremities:   No clubbing, cyanosis or edema.  DP +1 Neuro: Alert and oriented X 3. Psych:  Good affect, responds appropriately     Radiology:  Abdominal CT - mild bowel thickeneing of the segment of the distal ileum and sigmoid colon, mild dilatation of the main pancreatic duct with no obvious mass but study limited by anasarca  Chest xray:  Increased interstitial markings at the lung bases especially right c/w CHF  EKG:  Atrial fibrillation with RVR, ST depression in the anterolateral leads c/w ischemia  ASSESSMENT:  1.  S/P NSTEMI with progression of native CAD with complex lesions in distal LM, RCA and left circ. 2.  ESRD on HD 3.  HTN 4.  Dyslipidemia - statin intolerant 5.  GERD 6.  COPD 7.  History of pancytopenia 8.   Thickened bowel in distal ileum and sigmoid colon by CT of ? Significance 9.  Mild dilatation of pancreatic duct of ? Significance 10.  Atrial fibrillation with RVR now in NSR  PLAN:   1.  Admit to CCU 2.  Continue IV NTG gtt 3.  ASA/Plavix/beta blocker/ARB 4.  NPO after midnight 5.  PCI of left circ/RCA in am by Dr. Kirke Corin 6.  Consider GI consult for abnormal findings on abdominal CT 7.  IV Heparin d/c'd by Cardiology at [facility] due to recent anemia requiring transfusions  Quintella Reichert, MD  11/03/2011  10:19 PM

## 2011-11-04 ENCOUNTER — Encounter (HOSPITAL_COMMUNITY)
Admission: RE | Disposition: A | Discharge status: Home / Self Care | Payer: Self-pay | Source: Ambulatory Visit | Attending: Cardiovascular Disease

## 2011-11-04 ENCOUNTER — Inpatient Hospital Stay (HOSPITAL_COMMUNITY): Payer: Medicare Other

## 2011-11-04 DIAGNOSIS — I214 Non-ST elevation (NSTEMI) myocardial infarction: Secondary | ICD-10-CM

## 2011-11-04 DIAGNOSIS — I2510 Atherosclerotic heart disease of native coronary artery without angina pectoris: Secondary | ICD-10-CM

## 2011-11-04 HISTORY — PX: PERCUTANEOUS CORONARY STENT INTERVENTION (PCI-S): SHX5485

## 2011-11-04 LAB — BASIC METABOLIC PANEL
Calcium: 9.3 mg/dL (ref 8.4–10.5)
GFR calc non Af Amer: 7 mL/min — ABNORMAL LOW (ref >90)
Glucose, Bld: 113 mg/dL — ABNORMAL HIGH (ref 70–99)
Potassium: 4.9 mEq/L (ref 3.5–5.1)
Sodium: 138 mEq/L (ref 135–145)

## 2011-11-04 LAB — COMPREHENSIVE METABOLIC PANEL
Albumin: 3 g/dL — ABNORMAL LOW (ref 3.5–5.2)
Alkaline Phosphatase: 122 U/L — ABNORMAL HIGH (ref 39–117)
Alkaline Phosphatase: 100 U/L (ref 39–117)
BUN: 56 mg/dL — ABNORMAL HIGH (ref 6–23)
BUN: 67 mg/dL — ABNORMAL HIGH (ref 6–23)
CO2: 27 mEq/L (ref 19–32)
CO2: 25 mEq/L (ref 19–32)
Chloride: 95 mEq/L — ABNORMAL LOW (ref 96–112)
Chloride: 92 mEq/L — ABNORMAL LOW (ref 96–112)
GFR calc Af Amer: 7 mL/min — ABNORMAL LOW (ref >90)
GFR calc non Af Amer: 6 mL/min — ABNORMAL LOW (ref >90)
Glucose, Bld: 135 mg/dL — ABNORMAL HIGH (ref 70–99)
Potassium: 5.1 mEq/L (ref 3.5–5.1)
Potassium: 5.4 mEq/L — ABNORMAL HIGH (ref 3.5–5.1)
Total Bilirubin: 0.3 mg/dL (ref 0.3–1.2)
Total Bilirubin: 0.3 mg/dL (ref 0.3–1.2)
Total Protein: 5.8 g/dL — ABNORMAL LOW (ref 6.0–8.3)

## 2011-11-04 LAB — CBC
HCT: 33.6 % — ABNORMAL LOW (ref 39.0–52.0)
Hemoglobin: 10.9 g/dL — ABNORMAL LOW (ref 13.0–17.0)
MCHC: 32.4 g/dL (ref 30.0–36.0)

## 2011-11-04 LAB — MRSA PCR SCREENING: MRSA by PCR: NEGATIVE

## 2011-11-04 LAB — PHOSPHORUS: Phosphorus: 7.1 mg/dL — ABNORMAL HIGH (ref 2.3–4.6)

## 2011-11-04 LAB — MAGNESIUM: Magnesium: 2.1 mg/dL (ref 1.5–2.5)

## 2011-11-04 LAB — POCT ACTIVATED CLOTTING TIME: Activated Clotting Time: 589 seconds

## 2011-11-04 SURGERY — PERCUTANEOUS CORONARY STENT INTERVENTION (PCI-S)
Anesthesia: LOCAL

## 2011-11-04 MED ORDER — LIDOCAINE HCL (PF) 1 % IJ SOLN
INTRAMUSCULAR | Status: AC
  Filled 2011-11-04: qty 30

## 2011-11-04 MED ORDER — BIVALIRUDIN 250 MG IV SOLR
INTRAVENOUS | Status: AC
  Filled 2011-11-04: qty 250

## 2011-11-04 MED ORDER — PARICALCITOL 5 MCG/ML IV SOLN
6.0000 ug | INTRAVENOUS | Status: DC
  Administered 2011-11-04: 6 ug via INTRAVENOUS
  Filled 2011-11-04 (×2): qty 1.2

## 2011-11-04 MED ORDER — LIDOCAINE HCL (PF) 1 % IJ SOLN: 5.0000 mL | INTRAMUSCULAR | Status: DC | PRN

## 2011-11-04 MED ORDER — METOCLOPRAMIDE HCL 5 MG PO TABS
5.0000 mg | ORAL_TABLET | Freq: Three times a day (TID) | ORAL | Status: DC
  Administered 2011-11-04 – 2011-11-05 (×3): 5 mg via ORAL
  Filled 2011-11-04 (×6): qty 1

## 2011-11-04 MED ORDER — METHYLPREDNISOLONE SODIUM SUCC 40 MG IJ SOLR: 40.0000 mg | Freq: Every day | INTRAMUSCULAR | Status: DC

## 2011-11-04 MED ORDER — RENA-VITE PO TABS
1.0000 | ORAL_TABLET | Freq: Every day | ORAL | Status: DC
  Administered 2011-11-04 – 2011-11-05 (×2): 1 via ORAL
  Filled 2011-11-04 (×2): qty 1

## 2011-11-04 MED ORDER — ISOSORBIDE MONONITRATE ER 60 MG PO TB24: 60.0000 mg | ORAL_TABLET | Freq: Every day | ORAL | Status: DC

## 2011-11-04 MED ORDER — PANTOPRAZOLE SODIUM 40 MG IV SOLR
40.0000 mg | INTRAVENOUS | Status: DC
  Filled 2011-11-04: qty 40

## 2011-11-04 MED ORDER — HEPARIN (PORCINE) IN NACL 2-0.9 UNIT/ML-% IJ SOLN
INTRAMUSCULAR | Status: AC
  Filled 2011-11-04: qty 1000

## 2011-11-04 MED ORDER — ALBUTEROL SULFATE HFA 108 (90 BASE) MCG/ACT IN AERS
2.0000 | INHALATION_SPRAY | Freq: Four times a day (QID) | RESPIRATORY_TRACT | Status: DC | PRN
  Filled 2011-11-04: qty 6.7

## 2011-11-04 MED ORDER — LIDOCAINE-PRILOCAINE 2.5-2.5 % EX CREA: 1.0000 "application " | TOPICAL_CREAM | CUTANEOUS | Status: DC | PRN

## 2011-11-04 MED ORDER — LANTHANUM CARBONATE 500 MG PO CHEW
2000.0000 mg | CHEWABLE_TABLET | Freq: Two times a day (BID) | ORAL | Status: DC
  Administered 2011-11-04 – 2011-11-05 (×2): 2000 mg via ORAL
  Filled 2011-11-04 (×4): qty 4

## 2011-11-04 MED ORDER — NITROGLYCERIN 0.4 MG SL SUBL: 0.4000 mg | SUBLINGUAL_TABLET | SUBLINGUAL | Status: DC | PRN

## 2011-11-04 MED ORDER — MORPHINE SULFATE 2 MG/ML IJ SOLN
2.0000 mg | INTRAMUSCULAR | Status: DC | PRN
  Administered 2011-11-04: 2 mg via INTRAVENOUS

## 2011-11-04 MED ORDER — CHOLECALCIFEROL 250 MCG (10000 UT) PO CAPS: 2.0000 | ORAL_CAPSULE | Freq: Every day | ORAL | Status: DC

## 2011-11-04 MED ORDER — LEVALBUTEROL HCL 1.25 MG/0.5ML IN NEBU
1.2500 mg | INHALATION_SOLUTION | RESPIRATORY_TRACT | Status: DC
  Administered 2011-11-04 – 2011-11-05 (×3): 1.25 mg via RESPIRATORY_TRACT
  Filled 2011-11-04 (×11): qty 0.5

## 2011-11-04 MED ORDER — DARBEPOETIN ALFA-POLYSORBATE 25 MCG/0.42ML IJ SOLN: 25.0000 ug | INTRAMUSCULAR | Status: DC

## 2011-11-04 MED ORDER — ACETAMINOPHEN 325 MG PO TABS
650.0000 mg | ORAL_TABLET | Freq: Four times a day (QID) | ORAL | Status: DC | PRN
  Administered 2011-11-05: 650 mg via ORAL
  Filled 2011-11-04: qty 2

## 2011-11-04 MED ORDER — PENTAFLUOROPROP-TETRAFLUOROETH EX AERO: 1.0000 "application " | INHALATION_SPRAY | CUTANEOUS | Status: DC | PRN

## 2011-11-04 MED ORDER — NITROGLYCERIN 0.2 MG/ML ON CALL CATH LAB
INTRAVENOUS | Status: AC
  Filled 2011-11-04: qty 1

## 2011-11-04 MED ORDER — PREDNISONE 20 MG PO TABS
40.0000 mg | ORAL_TABLET | Freq: Every day | ORAL | Status: DC
  Administered 2011-11-04 – 2011-11-05 (×2): 40 mg via ORAL
  Filled 2011-11-04 (×3): qty 2

## 2011-11-04 MED ORDER — MORPHINE SULFATE 2 MG/ML IJ SOLN
INTRAMUSCULAR | Status: AC
  Administered 2011-11-04: 2 mg via INTRAVENOUS
  Filled 2011-11-04: qty 1

## 2011-11-04 MED ORDER — FLUTICASONE-SALMETEROL 250-50 MCG/DOSE IN AEPB
1.0000 | INHALATION_SPRAY | Freq: Two times a day (BID) | RESPIRATORY_TRACT | Status: DC
  Administered 2011-11-05: 1 via RESPIRATORY_TRACT
  Filled 2011-11-04: qty 14

## 2011-11-04 MED ORDER — HEPARIN SODIUM (PORCINE) 1000 UNIT/ML DIALYSIS: 40.0000 [IU]/kg | Freq: Once | INTRAMUSCULAR | Status: DC

## 2011-11-04 MED ORDER — METOPROLOL TARTRATE 50 MG PO TABS: 50.0000 mg | ORAL_TABLET | Freq: Two times a day (BID) | ORAL | Status: DC

## 2011-11-04 MED ORDER — MIDAZOLAM HCL 2 MG/2ML IJ SOLN
INTRAMUSCULAR | Status: AC
  Filled 2011-11-04: qty 2

## 2011-11-04 MED ORDER — SENNA 8.6 MG PO TABS
2.0000 | ORAL_TABLET | Freq: Every day | ORAL | Status: DC | PRN
  Administered 2011-11-05: 17.2 mg via ORAL
  Filled 2011-11-04: qty 2

## 2011-11-04 MED ORDER — LOSARTAN POTASSIUM 50 MG PO TABS
100.0000 mg | ORAL_TABLET | Freq: Every day | ORAL | Status: DC
  Filled 2011-11-04: qty 2

## 2011-11-04 MED ORDER — ASPIRIN EC 325 MG PO TBEC
325.0000 mg | DELAYED_RELEASE_TABLET | Freq: Every day | ORAL | Status: DC
  Administered 2011-11-05: 325 mg via ORAL
  Filled 2011-11-04 (×2): qty 1

## 2011-11-04 MED ORDER — NEPRO/CARBSTEADY PO LIQD
237.0000 mL | ORAL | Status: DC | PRN
  Filled 2011-11-04: qty 237

## 2011-11-04 MED ORDER — SODIUM CHLORIDE 0.9 % IV SOLN: 100.0000 mL | INTRAVENOUS | Status: DC | PRN

## 2011-11-04 MED ORDER — ALTEPLASE 2 MG IJ SOLR
2.0000 mg | Freq: Once | INTRAMUSCULAR | Status: AC | PRN
  Filled 2011-11-04: qty 2

## 2011-11-04 MED ORDER — METOCLOPRAMIDE HCL 5 MG PO TABS: 5.0000 mg | ORAL_TABLET | Freq: Three times a day (TID) | ORAL | Status: DC

## 2011-11-04 MED ORDER — FLUTICASONE-SALMETEROL 250-50 MCG/DOSE IN AEPB: 1.0000 | INHALATION_SPRAY | Freq: Two times a day (BID) | RESPIRATORY_TRACT | Status: DC

## 2011-11-04 MED ORDER — CLOPIDOGREL BISULFATE 75 MG PO TABS: 75.0000 mg | ORAL_TABLET | Freq: Every day | ORAL | Status: DC

## 2011-11-04 MED ORDER — ISOSORBIDE MONONITRATE ER 60 MG PO TB24
60.0000 mg | ORAL_TABLET | Freq: Every day | ORAL | Status: DC
  Administered 2011-11-04 – 2011-11-05 (×2): 60 mg via ORAL
  Filled 2011-11-04 (×2): qty 1

## 2011-11-04 MED ORDER — DIPHENHYDRAMINE HCL 25 MG PO CAPS: 25.0000 mg | ORAL_CAPSULE | Freq: Four times a day (QID) | ORAL | Status: DC | PRN

## 2011-11-04 MED ORDER — HYDRALAZINE HCL 50 MG PO TABS
50.0000 mg | ORAL_TABLET | Freq: Three times a day (TID) | ORAL | Status: DC
  Administered 2011-11-04 – 2011-11-05 (×3): 50 mg via ORAL
  Filled 2011-11-04 (×5): qty 1

## 2011-11-04 MED ORDER — PREDNISONE 20 MG PO TABS: 40.0000 mg | ORAL_TABLET | Freq: Every day | ORAL | Status: DC

## 2011-11-04 MED ORDER — HYDROCODONE-ACETAMINOPHEN 5-325 MG PO TABS: 1.0000 | ORAL_TABLET | Freq: Four times a day (QID) | ORAL | Status: DC | PRN

## 2011-11-04 MED ORDER — HEPARIN SODIUM (PORCINE) 1000 UNIT/ML DIALYSIS: 1000.0000 [IU] | INTRAMUSCULAR | Status: DC | PRN

## 2011-11-04 MED ORDER — SEVELAMER CARBONATE 800 MG PO TABS
800.0000 mg | ORAL_TABLET | Freq: Three times a day (TID) | ORAL | Status: DC
  Administered 2011-11-04 – 2011-11-05 (×3): 800 mg via ORAL
  Filled 2011-11-04 (×5): qty 1

## 2011-11-04 MED ORDER — ONDANSETRON HCL 4 MG/2ML IJ SOLN: 4.0000 mg | INTRAMUSCULAR | Status: DC | PRN

## 2011-11-04 MED ORDER — DOCUSATE SODIUM 100 MG PO CAPS
100.0000 mg | ORAL_CAPSULE | Freq: Two times a day (BID) | ORAL | Status: DC
  Administered 2011-11-05: 100 mg via ORAL
  Filled 2011-11-04 (×3): qty 1

## 2011-11-04 MED ORDER — SENNOSIDES-DOCUSATE SODIUM 8.6-50 MG PO TABS
1.0000 | ORAL_TABLET | Freq: Two times a day (BID) | ORAL | Status: DC
  Administered 2011-11-04: 1 via ORAL
  Filled 2011-11-04: qty 1

## 2011-11-04 MED ORDER — LEVALBUTEROL HCL 1.25 MG/0.5ML IN NEBU: 1.2500 mg | INHALATION_SOLUTION | RESPIRATORY_TRACT | Status: DC

## 2011-11-04 MED ORDER — IPRATROPIUM BROMIDE 0.02 % IN SOLN
500.0000 ug | RESPIRATORY_TRACT | Status: DC
  Administered 2011-11-04 – 2011-11-05 (×3): 500 ug via RESPIRATORY_TRACT
  Filled 2011-11-04 (×4): qty 2.5

## 2011-11-04 MED ORDER — METOPROLOL TARTRATE 50 MG PO TABS
50.0000 mg | ORAL_TABLET | Freq: Two times a day (BID) | ORAL | Status: DC
  Administered 2011-11-04 – 2011-11-05 (×3): 50 mg via ORAL
  Filled 2011-11-04 (×4): qty 1

## 2011-11-04 MED ORDER — LOSARTAN POTASSIUM 50 MG PO TABS
100.0000 mg | ORAL_TABLET | Freq: Every day | ORAL | Status: DC
  Administered 2011-11-04: 100 mg via ORAL
  Filled 2011-11-04: qty 2

## 2011-11-04 MED ORDER — PARICALCITOL 5 MCG/ML IV SOLN
INTRAVENOUS | Status: AC
  Administered 2011-11-04: 6 ug via INTRAVENOUS
  Filled 2011-11-04: qty 2

## 2011-11-04 MED ORDER — IPRATROPIUM BROMIDE 0.02 % IN SOLN: 500.0000 ug | RESPIRATORY_TRACT | Status: DC

## 2011-11-04 MED ORDER — FENTANYL CITRATE 0.05 MG/ML IJ SOLN
INTRAMUSCULAR | Status: AC
  Filled 2011-11-04: qty 2

## 2011-11-04 MED ORDER — AMLODIPINE BESYLATE 5 MG PO TABS
5.0000 mg | ORAL_TABLET | Freq: Every day | ORAL | Status: DC
  Administered 2011-11-04: 5 mg via ORAL
  Filled 2011-11-04: qty 1

## 2011-11-04 MED ORDER — HYDRALAZINE HCL 25 MG PO TABS
25.0000 mg | ORAL_TABLET | Freq: Three times a day (TID) | ORAL | Status: DC
  Administered 2011-11-04: 25 mg via ORAL
  Filled 2011-11-04 (×4): qty 1

## 2011-11-04 MED ORDER — MORPHINE SULFATE 2 MG/ML IJ SOLN: 2.0000 mg | INTRAMUSCULAR | Status: DC | PRN

## 2011-11-04 MED ORDER — DOCUSATE SODIUM 100 MG PO CAPS
100.0000 mg | ORAL_CAPSULE | Freq: Two times a day (BID) | ORAL | Status: DC
  Administered 2011-11-04: 100 mg via ORAL
  Filled 2011-11-04 (×2): qty 1

## 2011-11-04 MED ORDER — AMLODIPINE BESYLATE 5 MG PO TABS
5.0000 mg | ORAL_TABLET | Freq: Every day | ORAL | Status: DC
  Filled 2011-11-04: qty 1

## 2011-11-04 MED ORDER — LOSARTAN POTASSIUM 50 MG PO TABS: 100.0000 mg | ORAL_TABLET | Freq: Every day | ORAL | Status: DC

## 2011-11-04 MED ORDER — PANTOPRAZOLE SODIUM 40 MG PO TBEC
40.0000 mg | DELAYED_RELEASE_TABLET | Freq: Every day | ORAL | Status: DC
  Administered 2011-11-04 – 2011-11-05 (×2): 40 mg via ORAL
  Filled 2011-11-04 (×2): qty 1

## 2011-11-04 MED ORDER — VITAMIN D3 25 MCG (1000 UNIT) PO TABS
2000.0000 [IU] | ORAL_TABLET | Freq: Every day | ORAL | Status: DC
  Administered 2011-11-04: 2000 [IU] via ORAL
  Filled 2011-11-04: qty 2

## 2011-11-04 NOTE — Progress Notes (Signed)
SUBJECTIVE: No chest pain today. He is still on NTG drip. He was transferred from [facility] from complex PCI.    Filed Vitals:   11/04/11 0400 11/04/11 0500 11/04/11 0600 11/04/11 0700  BP: 141/63  160/69 169/69  Pulse: 79 89 85 75  Temp: 97.6 F (36.4 C)   97.5 F (36.4 C)  TempSrc: Oral   Oral  Resp: 12 22 17 13   Height:      Weight:      SpO2: 100% 97% 97% 100%    Intake/Output Summary (Last 24 hours) at 11/04/11 0804 Last data filed at 11/04/11 0600  Gross per 24 hour  Intake 353.68 ml  Output      0 ml  Net 353.68 ml    LABS: Basic Metabolic Panel:  Basename 11/04/11 0512 11/03/11 2315  NA 138 137  K 4.9 5.1  CL 95* 95*  CO2 28 27  GLUCOSE 113* 135*  BUN 59* 56*  CREATININE 7.52* 7.22*  CALCIUM 9.3 9.4  MG -- 2.1  PHOS -- --   Liver Function Tests:  Basename 11/03/11 2315  AST 18  ALT 12  ALKPHOS 122*  BILITOT 0.3  PROT 5.9*  ALBUMIN 3.0*   No results found for this basename: LIPASE:2,AMYLASE:2 in the last 72 hours CBC:  Basename 11/03/11 2315  WBC 7.1  NEUTROABS 6.2  HGB 11.3*  HCT 34.3*  MCV 96.3  PLT 148*   Cardiac Enzymes: No results found for this basename: CKTOTAL:3,CKMB:3,CKMBINDEX:3,TROPONINI:3 in the last 72 hours BNP: No components found with this basename: POCBNP:3 D-Dimer: No results found for this basename: DDIMER:2 in the last 72 hours Hemoglobin A1C: No results found for this basename: HGBA1C in the last 72 hours Fasting Lipid Panel: No results found for this basename: CHOL,HDL,LDLCALC,TRIG,CHOLHDL,LDLDIRECT in the last 72 hours Thyroid Function Tests: No results found for this basename: TSH,T4TOTAL,FREET3,T3FREE,THYROIDAB in the last 72 hours Anemia Panel: No results found for this basename: VITAMINB12,FOLATE,FERRITIN,TIBC,IRON,RETICCTPCT in the last 72 hours   PHYSICAL EXAM General: Well developed, well nourished, in no acute distress HEENT:  Normocephalic and atramatic Neck:  No JVD.  Lungs: Clear bilaterally to  auscultation and percussion. Heart: HRRR . Normal S1 and S2 without gallops or murmurs.  Abdomen: Bowel sounds are positive, abdomen soft and non-tender  Msk:  Back normal, normal gait. Normal strength and tone for age. Extremities: No clubbing, cyanosis or edema.   Neuro: Alert and oriented X 3. Psych:  Good affect, responds appropriately  TELEMETRY: Reviewed telemetry pt in NSR:  ASSESSMENT AND PLAN:  1. S/P NSTEMI likely supply demand ischemia but with continued angina requiring high dose NTG drip. Cath showed patent LIMA to LAD , SVG to diagonal and SVG to OM1. However, there was 90% mid LCX stenosis after OM1 (with 60% distal left main stenosis) and 99% mid RCA (not bypassed). Coronary vessels were heavily calcified. Will attempt RCA PCI today. OM2/OM3 distribution is a larger area but PCI would be very complex in that area requiring LM PCI with atherectomy.   2. ESRD on HD : consult nephrology.  3. HTN : resume meds.  4. Dyslipidemia - statin intolerant  5. GERD  6. COPD  7. History of pancytopenia  8. Thickened bowel in distal ileum and sigmoid colon by CT of ? Significance  9. Mild dilatation of pancreatic duct of ? Significance  10. Atrial fibrillation with RVR now in NSR. No anticoagulation due to recent anemia requiring transfusion.    Lorine Bears, MD, [facility] 11/04/2011  8:04 AM

## 2011-11-04 NOTE — CV Procedure (Signed)
   CARDIAC CATH NOTE  Name: Russell [name] MRN: [id number] DOB: Aug 14, 1950  Procedure: PTCA and stenting of the mid RCA with a drug-eluting stent placement.  Indication: Non-ST elevation myocardial infarction with refractory angina.  Medications:  Sedation:  1 mg IV Versed, 50 mcg IV Fentanyl  Contrast:  110 mL Omnipaque  Procedural Details: The right groin was prepped, draped, and anesthetized with 1% lidocaine. Using the modified Seldinger technique, a 6 Fr sheath was introduced into the right femoral artery.  Weight-based bivalirudin was given for anticoagulation. Once a therapeutic ACT was achieved, a 6 [place] JR 4 guide catheter was inserted.  An intuition coronary guidewire was used to cross the lesion with mild difficulty. The lesion was heavily calcified. I could not pass a 2.5 balloon. Thus, I used a 1.2 x 6 mm balloon which cross the lesion with some difficulty. The lesion was dilated with multiple inflations to a maximum of 14 atmospheres. I then used a 2.5 x 12 mm balloon to dilate the lesion to 12 atmosphere. I could not pass the stent due to significant calcifications. Thus, I used another intuition wire as a buddy wire.  The lesion was then stented with a 2.75 x 18 mm Xience expedition stent.  The stent was postdilated with a 3.0 x 10 mm noncompliant balloon.  Following PCI, there was 0% residual stenosis and TIMI-3 flow. Final angiography confirmed an excellent result. The patient tolerated the procedure well. There were no immediate procedural complications. Femoral hemostasis was achieved with Mynx closure device. The patient was transferred to the post catheterization recovery area for further monitoring.  PCI Data: Vessel - mid RCA/Segment - 2 Percent Stenosis (pre)   99% TIMI-flow 2 Stent 2.75 x 18 mm Xience expedition which was postdilated with a 3.0 noncompliant balloon Percent Stenosis (post) 0% TIMI-flow (post) 3  Final Conclusions:  Successful angioplasty and  drug-eluting stent placement to the mid RCA for a 99% lesion with initial TIMI 2 flow with 0% residual stenosis and TIMI-3 flow post PCI. This was a very difficult procedure due to heavy calcifications.  Recommendations:  Dual antiplatelet therapy for at least one year. High risk angioplasty of the left main and left circumflex with atherectomy should be left as a last resort if the patient continues to have anginal symptoms. If the patient remains stable, he can likely be discharged home tomorrow. He can followup with me in the [place] office in one to 2 weeks.  Lorine Bears MD, [facility] 11/04/2011, 12:13 PM

## 2011-11-04 NOTE — H&P (View-Only) (Signed)
 SUBJECTIVE: No chest pain today. He is still on NTG drip. He was transferred from [facility] from complex PCI.    Filed Vitals:   11/04/11 0400 11/04/11 0500 11/04/11 0600 11/04/11 0700  BP: 141/63  160/69 169/69  Pulse: 79 89 85 75  Temp: 97.6 F (36.4 C)   97.5 F (36.4 C)  TempSrc: Oral   Oral  Resp: 12 22 17 13  Height:      Weight:      SpO2: 100% 97% 97% 100%    Intake/Output Summary (Last 24 hours) at 11/04/11 0804 Last data filed at 11/04/11 0600  Gross per 24 hour  Intake 353.68 ml  Output      0 ml  Net 353.68 ml    LABS: Basic Metabolic Panel:  Basename 11/04/11 0512 11/03/11 2315  NA 138 137  K 4.9 5.1  CL 95* 95*  CO2 28 27  GLUCOSE 113* 135*  BUN 59* 56*  CREATININE 7.52* 7.22*  CALCIUM 9.3 9.4  MG -- 2.1  PHOS -- --   Liver Function Tests:  Basename 11/03/11 2315  AST 18  ALT 12  ALKPHOS 122*  BILITOT 0.3  PROT 5.9*  ALBUMIN 3.0*   No results found for this basename: LIPASE:2,AMYLASE:2 in the last 72 hours CBC:  Basename 11/03/11 2315  WBC 7.1  NEUTROABS 6.2  HGB 11.3*  HCT 34.3*  MCV 96.3  PLT 148*   Cardiac Enzymes: No results found for this basename: CKTOTAL:3,CKMB:3,CKMBINDEX:3,TROPONINI:3 in the last 72 hours BNP: No components found with this basename: POCBNP:3 D-Dimer: No results found for this basename: DDIMER:2 in the last 72 hours Hemoglobin A1C: No results found for this basename: HGBA1C in the last 72 hours Fasting Lipid Panel: No results found for this basename: CHOL,HDL,LDLCALC,TRIG,CHOLHDL,LDLDIRECT in the last 72 hours Thyroid Function Tests: No results found for this basename: TSH,T4TOTAL,FREET3,T3FREE,THYROIDAB in the last 72 hours Anemia Panel: No results found for this basename: VITAMINB12,FOLATE,FERRITIN,TIBC,IRON,RETICCTPCT in the last 72 hours   PHYSICAL EXAM General: Well developed, well nourished, in no acute distress HEENT:  Normocephalic and atramatic Neck:  No JVD.  Lungs: Clear bilaterally to  auscultation and percussion. Heart: HRRR . Normal S1 and S2 without gallops or murmurs.  Abdomen: Bowel sounds are positive, abdomen soft and non-tender  Msk:  Back normal, normal gait. Normal strength and tone for age. Extremities: No clubbing, cyanosis or edema.   Neuro: Alert and oriented X 3. Psych:  Good affect, responds appropriately  TELEMETRY: Reviewed telemetry pt in NSR:  ASSESSMENT AND PLAN:  1. S/P NSTEMI likely supply demand ischemia but with continued angina requiring high dose NTG drip. Cath showed patent LIMA to LAD , SVG to diagonal and SVG to OM1. However, there was 90% mid LCX stenosis after OM1 (with 60% distal left main stenosis) and 99% mid RCA (not bypassed). Coronary vessels were heavily calcified. Will attempt RCA PCI today. OM2/OM3 distribution is a larger area but PCI would be very complex in that area requiring LM PCI with atherectomy.   2. ESRD on HD : consult nephrology.  3. HTN : resume meds.  4. Dyslipidemia - statin intolerant  5. GERD  6. COPD  7. History of pancytopenia  8. Thickened bowel in distal ileum and sigmoid colon by CT of ? Significance  9. Mild dilatation of pancreatic duct of ? Significance  10. Atrial fibrillation with RVR now in NSR. No anticoagulation due to recent anemia requiring transfusion.    Muhammad Arida, MD, FACC 11/04/2011   8:04 AM     

## 2011-11-04 NOTE — Interval H&P Note (Signed)
History and Physical Interval Note:  11/04/2011 10:32 AM  Russell [name]  has presented today for surgery, with the diagnosis of [place]  The various methods of treatment have been discussed with the patient and family. After consideration of risks, benefits and other options for treatment, the patient has consented to  Procedure(s) (LRB) with comments: PERCUTANEOUS CORONARY STENT INTERVENTION (PCI-S) (N/A) as a surgical intervention .  The patient's history has been reviewed, patient examined, no change in status, stable for surgery.  I have reviewed the patient's chart and labs.  Questions were answered to the patient's satisfaction.     Lorine Bears

## 2011-11-04 NOTE — Procedures (Signed)
Pt seen on HD.[name] Ap 170  VP 190  SBP 146.  No CP

## 2011-11-04 NOTE — Consult Note (Signed)
[facility] Consult Note [facility]  Date: 11/04/2011  Patient name: Russell [name] Medical record number: [id number] Date of birth: [birth year] Age: 61 y.o. Gender: male PCP: No primary provider on file.  Medical Service: Cardiology  Conulting physician: Dr. Lorine Bears     Chief Complaint: NSTEMI  Source: Pt who is a questionable historian  History of Present Illness: Russell [name] is a 61yo AA male with a PMH of CAD s/p CABG 1 yr ago, ESRD on HD following failed kidney tx in 2007, DM, COPD (current everyday smoker), HTN, and anemia. He was admitted to the hospital for PCI following an NSTEMI on 9/24 with an RCA occulusion of 99%. 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 States he has been on dialysis for 2 years since his failed transplant. He is unable to tell me why the transplant failed. He currently receives his dialysis at [facility] on MWF. Had stent placed this morning. Denies nausea, vomiting, and diarrhea.  Original disease Membranous Nx.  ? Why lost Tx.  Recent prob at HD with N, V.  Mother was on HD Meds: Prior to Admission medications   Medication Sig Start Date End Date Taking? Authorizing Provider  acetaminophen (TYLENOL) 325 MG tablet Take 650 mg by mouth every 6 (six) hours as needed. Pain / fever   Yes Historical Provider, MD  albuterol (PROVENTIL HFA;VENTOLIN HFA) 108 (90 BASE) MCG/ACT inhaler Inhale 2 puffs into the lungs every 6 (six) hours as needed. Wheezing or shortness of breath   Yes Historical Provider, MD  aspirin EC 325 MG tablet Take 325 mg by mouth daily.   Yes Historical Provider, MD  b complex-vitamin c-folic acid (NEPHRO-VITE) 0.8 MG TABS Take 0.8 mg by mouth every evening.   Yes Historical Provider, MD  cetirizine (ZYRTEC) 10 MG tablet Take 10 mg by mouth daily as needed. allergies   Yes Historical Provider, MD  Cholecalciferol 10000 UNITS CAPS Take 2 capsules by  mouth.   Yes Historical Provider, MD  clopidogrel (PLAVIX) 75 MG tablet Take 75 mg by mouth daily.   Yes Historical Provider, MD  diphenhydrAMINE (BENADRYL) 25 mg capsule Take 25 mg by mouth every 6 (six) hours as needed. itching   Yes Historical Provider, MD  docusate sodium (COLACE) 100 MG capsule Take 100 mg by mouth 2 (two) times daily. Stool softener   Yes Historical Provider, MD  Fluticasone-Salmeterol (ADVAIR) 250-50 MCG/DOSE AEPB Inhale 1 puff into the lungs 2 (two) times daily.   Yes Historical Provider, MD  hydrALAZINE (APRESOLINE) 20 MG/ML injection Inject 20 mg into the vein every 6 (six) hours as needed. For blood pressure >160/80   Yes Historical Provider, MD  hydrALAZINE (APRESOLINE) 50 MG tablet Take 50 mg by mouth 3 (three) times daily.   Yes Historical Provider, MD  HYDROcodone-acetaminophen (NORCO/VICODIN) 5-325 MG per tablet Take 1 tablet by mouth every 6 (six) hours as needed. pain   Yes Historical Provider, MD  ipratropium (ATROVENT) 0.02 % nebulizer solution Take 500 mcg by nebulization every 4 (four) hours.   Yes Historical Provider, MD  isosorbide mononitrate (IMDUR) 60 MG 24 hr tablet Take 60 mg by mouth daily.   Yes Historical Provider, MD  lanthanum (FOSRENOL) 500 MG chewable tablet Chew 2,000 mg by mouth 2 (two) times daily with a meal.   Yes Historical Provider, MD  levalbuterol (XOPENEX) 1.25 MG/0.5ML nebulizer solution Take 1 ampule by nebulization every 4 (four) hours.   Yes  Historical Provider, MD  losartan (COZAAR) 100 MG tablet Take 100 mg by mouth daily.   Yes Historical Provider, MD  methylPREDNISolone sodium succinate (SOLU-MEDROL) 40 mg/mL injection Inject 40 mg into the vein daily.   Yes Historical Provider, MD  metoCLOPramide (REGLAN) 5 MG tablet Take 5 mg by mouth 3 (three) times daily before meals.   Yes Historical Provider, MD  metoprolol (LOPRESSOR) 50 MG tablet Take 50 mg by mouth 2 (two) times daily.   Yes Historical Provider, MD  morphine 2 MG/ML  injection Inject 1-2 mg into the vein every 6 (six) hours as needed. pain   Yes Historical Provider, MD  nitroGLYCERIN (NITROSTAT) 0.4 MG SL tablet Place 0.4 mg under the tongue every 5 (five) minutes as needed. Chest pain   Yes Historical Provider, MD  NITROGLYCERIN IV Inject 250 mLs into the vein daily. 50mg /215ml Rate of 5-77mcg/min to painfree   Yes Historical Provider, MD  ondansetron (ZOFRAN) 4 MG/2ML SOLN injection Inject 4 mg into the vein every 4 (four) hours as needed. nausea   Yes Historical Provider, MD  pantoprazole (PROTONIX) 40 MG tablet Take 40 mg by mouth daily.   Yes Historical Provider, MD  predniSONE (DELTASONE) 20 MG tablet Take 40 mg by mouth daily.   Yes Historical Provider, MD  senna (SENOKOT) 8.6 MG TABS Take 2 tablets by mouth daily as needed. constipation   Yes Historical Provider, MD  sevelamer (RENAGEL) 400 MG tablet Take 400 mg by mouth 3 (three) times daily with meals.   Yes Historical Provider, MD   Current facility-administered medications:acetaminophen (TYLENOL) tablet 650 mg, 650 mg, Oral, Q6H PRN, Iran Ouch, MD;  albuterol (PROVENTIL HFA;VENTOLIN HFA) 108 (90 BASE) MCG/ACT inhaler 2 puff, 2 puff, Inhalation, Q6H PRN, Iran Ouch, MD;  amLODipine (NORVASC) tablet 5 mg, 5 mg, Oral, Daily, Iran Ouch, MD, 5 mg at 11/04/11 4098 aspirin chewable tablet 324 mg, 324 mg, Oral, Pre-Cath, Quintella Reichert, MD, 324 mg at 11/04/11 0601;  aspirin EC tablet 325 mg, 325 mg, Oral, Daily, Iran Ouch, MD;  bivalirudin (ANGIOMAX) 250 MG injection, , , , ;  cholecalciferol (VITAMIN D) tablet 2,000 Units, 2,000 Units, Oral, Daily, Iran Ouch, MD, 2,000 Units at 11/04/11 1511;  clopidogrel (PLAVIX) tablet 75 mg, 75 mg, Oral, Q breakfast, Quintella Reichert, MD, 75 mg at 11/04/11 0746 diphenhydrAMINE (BENADRYL) capsule 25 mg, 25 mg, Oral, Q6H PRN, Iran Ouch, MD;  docusate sodium (COLACE) capsule 100 mg, 100 mg, Oral, BID, Iran Ouch, MD;  fentaNYL  (SUBLIMAZE) 0.05 MG/ML injection, , , , ;  Fluticasone-Salmeterol (ADVAIR) 250-50 MCG/DOSE inhaler 1 puff, 1 puff, Inhalation, BID, Iran Ouch, MD;  heparin 2-0.9 UNIT/ML-% infusion, , , ,  hydrALAZINE (APRESOLINE) tablet 50 mg, 50 mg, Oral, TID, Iran Ouch, MD, 50 mg at 11/04/11 1510;  HYDROcodone-acetaminophen (NORCO/VICODIN) 5-325 MG per tablet 1 tablet, 1 tablet, Oral, Q6H PRN, Iran Ouch, MD;  ipratropium (ATROVENT) nebulizer solution 500 mcg, 500 mcg, Nebulization, Q4H, Iran Ouch, MD;  isosorbide mononitrate (IMDUR) 24 hr tablet 60 mg, 60 mg, Oral, Daily, Iran Ouch, MD, 60 mg at 11/04/11 1191 lanthanum (FOSRENOL) chewable tablet 2,000 mg, 2,000 mg, Oral, BID WC, Iran Ouch, MD;  levalbuterol (XOPENEX) nebulizer solution 1.25 mg, 1.25 mg, Nebulization, Q4H, Iran Ouch, MD;  lidocaine (XYLOCAINE) 1 % injection, , , , ;  losartan (COZAAR) tablet 100 mg, 100 mg, Oral, Daily, Iran Ouch, MD, 100 mg  at 11/04/11 1511;  metoCLOPramide (REGLAN) tablet 5 mg, 5 mg, Oral, TID AC, Iran Ouch, MD, 5 mg at 11/04/11 1418 metoprolol (LOPRESSOR) tablet 50 mg, 50 mg, Oral, BID, Iran Ouch, MD, 50 mg at 11/04/11 0926;  midazolam (VERSED) 2 MG/2ML injection, , , , ;  morphine 2 MG/ML injection 2 mg, 2 mg, Intravenous, Q4H PRN, Iran Ouch, MD;  multivitamin (RENA-VIT) tablet 1 tablet, 1 tablet, Oral, Daily, Iran Ouch, MD, 1 tablet at 11/04/11 1511;  nitroGLYCERIN (NITROSTAT) SL tablet 0.4 mg, 0.4 mg, Sublingual, Q5 Min x 3 PRN, Quintella Reichert, MD nitroGLYCERIN (NTG ON-CALL) 0.2 mg/mL injection, , , , ;  ondansetron (ZOFRAN) injection 4 mg, 4 mg, Intravenous, Q4H PRN, Iran Ouch, MD;  pantoprazole (PROTONIX) EC tablet 40 mg, 40 mg, Oral, Daily, Iran Ouch, MD, 40 mg at 11/04/11 1418;  predniSONE (DELTASONE) tablet 40 mg, 40 mg, Oral, Q breakfast, Iran Ouch, MD, 40 mg at 11/04/11 1191;  senna (SENOKOT) tablet 17.2 mg, 2 tablet, Oral,  Daily PRN, Iran Ouch, MD senna-docusate (Senokot-S) tablet 1 tablet, 1 tablet, Oral, BID, Iran Ouch, MD, 1 tablet at 11/04/11 4782;  sevelamer (RENVELA) tablet 800 mg, 800 mg, Oral, TID WC, Iran Ouch, MD;  DISCONTD: 0.9 %  sodium chloride infusion, 250 mL, Intravenous, PRN, Quintella Reichert, MD, Last Rate: 5 mL/hr at 11/03/11 2320, 250 mL at 11/03/11 2320 DISCONTD: acetaminophen (TYLENOL) tablet 650 mg, 650 mg, Oral, Q4H PRN, Quintella Reichert, MD, 650 mg at 11/04/11 0054;  DISCONTD: aspirin EC tablet 81 mg, 81 mg, Oral, Daily, Quintella Reichert, MD;  DISCONTD: Cholecalciferol CAPS 20,000 Units, 2 capsule, Oral, Daily, Iran Ouch, MD;  DISCONTD: clopidogrel (PLAVIX) tablet 75 mg, 75 mg, Oral, Daily, Iran Ouch, MD DISCONTD: docusate sodium (COLACE) capsule 100 mg, 100 mg, Oral, BID, Iran Ouch, MD, 100 mg at 11/04/11 9562;  DISCONTD: Fluticasone-Salmeterol (ADVAIR) 250-50 MCG/DOSE inhaler 1 puff, 1 puff, Inhalation, BID, Iran Ouch, MD;  DISCONTD: hydrALAZINE (APRESOLINE) tablet 25 mg, 25 mg, Oral, Q8H, Iran Ouch, MD, 25 mg at 11/04/11 1308 DISCONTD: ipratropium (ATROVENT) nebulizer solution 500 mcg, 500 mcg, Nebulization, Q4H, Iran Ouch, MD;  DISCONTD: isosorbide mononitrate (IMDUR) 24 hr tablet 60 mg, 60 mg, Oral, Daily, Iran Ouch, MD;  DISCONTD: levalbuterol (XOPENEX) nebulizer solution 1.25 mg, 1.25 mg, Nebulization, Q4H, Iran Ouch, MD;  DISCONTD: losartan (COZAAR) tablet 100 mg, 100 mg, Oral, Daily, Iran Ouch, MD DISCONTD: methylPREDNISolone sodium succinate (SOLU-MEDROL) 40 mg/mL injection 40 mg, 40 mg, Intravenous, Daily, Iran Ouch, MD;  DISCONTD: metoCLOPramide (REGLAN) tablet 5 mg, 5 mg, Oral, TID AC, Iran Ouch, MD;  DISCONTD: metoprolol (LOPRESSOR) tablet 50 mg, 50 mg, Oral, BID, Iran Ouch, MD;  DISCONTD: morphine 2 MG/ML injection 2 mg, 2 mg, Intravenous, Q4H PRN, Quintella Reichert, MD, 2 mg at 11/04/11  0107 DISCONTD: nitroGLYCERIN (NITROSTAT) SL tablet 0.4 mg, 0.4 mg, Sublingual, Q5 min PRN, Iran Ouch, MD;  DISCONTD: nitroGLYCERIN 0.2 mg/mL in dextrose 5 % infusion, 3-30 mcg/min, Intravenous, Titrated, Quintella Reichert, MD, Last Rate: 9 mL/hr at 11/04/11 0100, 30 mcg/min at 11/04/11 0100;  DISCONTD: ondansetron (ZOFRAN) injection 4 mg, 4 mg, Intravenous, Q6H PRN, Quintella Reichert, MD DISCONTD: pantoprazole (PROTONIX) injection 40 mg, 40 mg, Intravenous, Q24H, Iran Ouch, MD;  DISCONTD: predniSONE (DELTASONE) tablet 40 mg, 40 mg, Oral, Daily, Iran Ouch, MD;  DISCONTD: sodium chloride  0.9 % injection 3 mL, 3 mL, Intravenous, Q12H, Quintella Reichert, MD, 3 mL at 11/04/11 0931;  DISCONTD: sodium chloride 0.9 % injection 3 mL, 3 mL, Intravenous, PRN, Quintella Reichert, MD  Allergies: Statins Past Medical History  Diagnosis Date  . Hypertension   . Shortness of breath   . Pneumonia   . Anginal pain   . GERD (gastroesophageal reflux disease)   . Coronary artery disease 2012     S/P CABG with LIMA to LAD, SVG to D1, SBG to OM1  . Chronic kidney disease     ESRD on HD  . COPD (chronic obstructive pulmonary disease)   . Anemia     chronic disease  . Thrombocytopenia   . Secondary hyperparathyroidism of renal origin   . Dysrhythmia     atrial fibrillation  . Shingles   . Arthritis     right shoulder  . Myocardial infarction 10/2011, 06/2011    NSTEMI   Past Surgical History  Procedure Date  . Coronary angioplasty   . Coronary artery bypass graft   . Appendectomy   . Eye surgery     cataract surgery  . Transplantation renal     Failed   History reviewed. No pertinent family history. History   Social History  . Marital Status: Single    Spouse Name: N/A    Number of Children: N/A  . Years of Education: N/A   Occupational History  . Not on file.   Social History Main Topics  . Smoking status: Current Every Day Smoker -- 1.0 packs/day    Types: Cigarettes  .  Smokeless tobacco: Not on file  . Alcohol Use: No  . Drug Use: No  . Sexually Active: Yes   Other Topics Concern  . Not on file   Social History Narrative  . No narrative on file    Review of Systems: Pertinent items are noted in HPI.  Physical Exam: Blood pressure 136/72, pulse 72, temperature 97.5 F (36.4 C), temperature source Oral, resp. rate 19, height 5\' 9"  (1.753 m), weight 74.7 kg (164 lb 10.9 oz), SpO2 97.00%. BP 136/72  Pulse 72  Temp 97.5 F (36.4 C) (Oral)  Resp 19  Ht 5\' 9"  (1.753 m)  Wt 74.7 kg (164 lb 10.9 oz)  BMI 24.32 kg/m2  SpO2 97%  General Appearance:    AA Male, sitting comfortably in bed, Alert, cooperative, NAD Chronically ill.    Head:    Normocephalic, atraumatic Fundi with scarring  Eyes:    PERRL, conjunctiva/corneas are muddy, EOM's intact      Throat:   Lips and tongue normal; teeth and gums normal, mucosa pale  Lungs:     respirations unlabored, diminished breaths sounds bilaterally, crackles on left side.   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  Slowed mentation Lab results: Basic Metabolic Panel:  Lab 11/04/11 1478 11/03/11 2315  NA 138 137  K 4.9 5.1  CL 95* 95*  CO2 28 27  GLUCOSE 113* 135*  BUN 59* 56*  CREATININE 7.52* 7.22*  CALCIUM 9.3 9.4  ALB -- --  PHOS -- --   Liver Function Tests:  Lab 11/03/11 2315  AST 18  ALT 12  ALKPHOS 122*  BILITOT 0.3  PROT 5.9*  ALBUMIN 3.0*   CBC:  Lab 11/03/11 2315  WBC 7.1  NEUTROABS 6.2  HGB 11.3*  HCT 34.3*  MCV 96.3  PLT 148*   CBG:  Lab 11/03/11 2134  GLUCAP 142*   Micro Results: Recent Results (from the past  240 hour(s))  MRSA PCR SCREENING     Status: Normal   Collection Time   11/03/11  9:15 PM      Component Value Range Status Comment   MRSA by PCR NEGATIVE  NEGATIVE Final    Assessment & Plan by Problem: Mr. Filter is a 61yo AA male with a PMH of CAD s/p CABG 1 yr ago, ESRD on HD following failed kidney tx in 2007, DM, COPD (current everyday smoker), HTN, and anemia.  1. ESRD on HD- pt is currently receiving treatment on MWF  - will schedule for HD  - will continue to monitor CBC and CMET  Do HD , get records and modify. Vol xs.  2. S/P NSTEMI- pt had PCI with stent placement today  -  Continue per cardiology  3. Anemia- pts hg is stable today at 11.3  - will continue to monitor CBC 4 HPTH Get records 5 [place] This is a Medical Student Note.  The care of the patient was discussed with Dr. Darrick Penna and the assessment and plan was formulated with their assistance.  Please see their note for official documentation of the patient encounter.   Signed: V. [place], [place], PA-S2 11/04/2011, 3:33 PM  I have seen and examined this patient and agree with the plan of care seen and eval.  Will do HD and get records.  Eval old records here, and clinical status .  Records from [facility] requested .  Dyquan Minks L 11/04/2011, 5:07 PM

## 2011-11-05 ENCOUNTER — Encounter (HOSPITAL_COMMUNITY): Payer: Self-pay | Admitting: Nurse Practitioner

## 2011-11-05 DIAGNOSIS — I10 Essential (primary) hypertension: Secondary | ICD-10-CM | POA: Diagnosis present

## 2011-11-05 DIAGNOSIS — E119 Type 2 diabetes mellitus without complications: Secondary | ICD-10-CM

## 2011-11-05 DIAGNOSIS — R935 Abnormal findings on diagnostic imaging of other abdominal regions, including retroperitoneum: Secondary | ICD-10-CM

## 2011-11-05 DIAGNOSIS — I2510 Atherosclerotic heart disease of native coronary artery without angina pectoris: Secondary | ICD-10-CM | POA: Diagnosis present

## 2011-11-05 DIAGNOSIS — I214 Non-ST elevation (NSTEMI) myocardial infarction: Secondary | ICD-10-CM

## 2011-11-05 DIAGNOSIS — J449 Chronic obstructive pulmonary disease, unspecified: Secondary | ICD-10-CM

## 2011-11-05 DIAGNOSIS — N186 End stage renal disease: Secondary | ICD-10-CM | POA: Diagnosis present

## 2011-11-05 HISTORY — DX: Type 2 diabetes mellitus without complications: E11.9

## 2011-11-05 LAB — BASIC METABOLIC PANEL
BUN: 29 mg/dL — ABNORMAL HIGH (ref 6–23)
CO2: 27 mEq/L (ref 19–32)
Chloride: 98 mEq/L (ref 96–112)
Creatinine, Ser: 4.85 mg/dL — ABNORMAL HIGH (ref 0.50–1.35)

## 2011-11-05 LAB — CBC
HCT: 34 % — ABNORMAL LOW (ref 39.0–52.0)
MCV: 97.7 fL (ref 78.0–100.0)
Platelets: 147 10*3/uL — ABNORMAL LOW (ref 150–400)
RBC: 3.48 MIL/uL — ABNORMAL LOW (ref 4.22–5.81)
WBC: 6.9 10*3/uL (ref 4.0–10.5)

## 2011-11-05 MED ORDER — NITROGLYCERIN 0.4 MG SL SUBL: 0.4000 mg | SUBLINGUAL_TABLET | SUBLINGUAL | Status: AC | PRN

## 2011-11-05 MED ORDER — ISOSORBIDE MONONITRATE ER 60 MG PO TB24: 60.0000 mg | ORAL_TABLET | Freq: Every day | ORAL | Status: AC

## 2011-11-05 MED ORDER — CLOPIDOGREL BISULFATE 75 MG PO TABS: 75.0000 mg | ORAL_TABLET | Freq: Every day | ORAL | Status: DC

## 2011-11-05 MED ORDER — ASPIRIN 81 MG PO TBEC: 81.0000 mg | DELAYED_RELEASE_TABLET | Freq: Every day | ORAL | Status: AC

## 2011-11-05 MED ORDER — PREDNISONE 10 MG PO TABS: ORAL_TABLET | ORAL | Status: DC

## 2011-11-05 MED ORDER — METOPROLOL TARTRATE 50 MG PO TABS: 50.0000 mg | ORAL_TABLET | Freq: Two times a day (BID) | ORAL | Status: AC

## 2011-11-05 MED ORDER — AMLODIPINE BESYLATE 5 MG PO TABS: 5.0000 mg | ORAL_TABLET | Freq: Every day | ORAL | Status: DC

## 2011-11-05 MED FILL — Dextrose Inj 5%: INTRAVENOUS | Qty: 50 | Status: AC

## 2011-11-05 NOTE — Discharge Summary (Signed)
See full note this am. cdm 

## 2011-11-05 NOTE — Discharge Instructions (Signed)
.***  PLEASE REMEMBER TO BRING ALL OF YOUR MEDICATIONS TO EACH OF YOUR FOLLOW-UP OFFICE VISITS.  NO HEAVY LIFTING X 2 WEEKS. NO SEXUAL ACTIVITY X 2 WEEKS. NO DRIVING X 1 WEEK. NO SOAKING BATHS, HOT TUBS, POOLS, ETC., X 7 DAYS.

## 2011-11-05 NOTE — Progress Notes (Signed)
SUBJECTIVE: No chest pain or SOB.   BP 159/78  Pulse 72  Temp 98.5 F (36.9 C) (Oral)  Resp 16  Ht 5\' 9"  (1.753 m)  Wt 163 lb 12.8 oz (74.3 kg)  BMI 24.19 kg/m2  SpO2 98%  Intake/Output Summary (Last 24 hours) at 11/05/11 0653 Last data filed at 11/05/11 0500  Gross per 24 hour  Intake    956 ml  Output   2556 ml  Net  -1600 ml    PHYSICAL EXAM General: Well developed, well nourished, in no acute distress. Alert and oriented x 3.  Psych:  Good affect, responds appropriately Neck: No JVD. No masses noted.  Lungs: Rhonci  bilaterally  Heart: RRR with no murmurs noted. Abdomen: Bowel sounds are present. Soft, non-tender.  Extremities: No lower extremity edema.   LABS: Basic Metabolic Panel:  Basename 11/05/11 0510 11/04/11 1918 11/03/11 2315  NA 139 135 --  K 3.7 5.4* --  CL 98 92* --  CO2 27 25 --  GLUCOSE 125* 135* --  BUN 29* 67* --  CREATININE 4.85* 8.53* --  CALCIUM 9.4 9.5 --  MG -- -- 2.1  PHOS -- 7.1* --   CBC:  Basename 11/05/11 0510 11/04/11 1918 11/03/11 2315  WBC 6.9 6.5 --  NEUTROABS -- -- 6.2  HGB 11.1* 10.9* --  HCT 34.0* 33.6* --  MCV 97.7 96.8 --  PLT 147* 147* --   Current Meds:    . amLODipine  5 mg Oral QHS  . aspirin EC  325 mg Oral Daily  . bivalirudin      . clopidogrel  75 mg Oral Q breakfast  . darbepoetin (ARANESP) injection - DIALYSIS  25 mcg Intravenous Q Wed-HD  . docusate sodium  100 mg Oral BID  . fentaNYL      . Fluticasone-Salmeterol  1 puff Inhalation BID  . heparin      . heparin  40 Units/kg Dialysis Once in dialysis  . hydrALAZINE  50 mg Oral TID  . ipratropium  500 mcg Nebulization Q4H  . isosorbide mononitrate  60 mg Oral Daily  . lanthanum  2,000 mg Oral BID WC  . levalbuterol  1.25 mg Nebulization Q4H  . lidocaine      . losartan  100 mg Oral QHS  . metoCLOPramide  5 mg Oral TID AC  . metoprolol tartrate  50 mg Oral BID  . midazolam      . multivitamin  1 tablet Oral Daily  . nitroGLYCERIN        . pantoprazole  40 mg Oral Daily  . paricalcitol  6 mcg Intravenous 3 times weekly  . predniSONE  40 mg Oral Q breakfast  . senna-docusate  1 tablet Oral BID  . sevelamer  800 mg Oral TID WC  . DISCONTD: amLODipine  5 mg Oral Daily  . DISCONTD: aspirin EC  81 mg Oral Daily  . DISCONTD: cholecalciferol  2,000 Units Oral Daily  . DISCONTD: Cholecalciferol  2 capsule Oral Daily  . DISCONTD: clopidogrel  75 mg Oral Daily  . DISCONTD: docusate sodium  100 mg Oral BID  . DISCONTD: Fluticasone-Salmeterol  1 puff Inhalation BID  . DISCONTD: hydrALAZINE  25 mg Oral Q8H  . DISCONTD: ipratropium  500 mcg Nebulization Q4H  . DISCONTD: isosorbide mononitrate  60 mg Oral Daily  . DISCONTD: levalbuterol  1.25 mg Nebulization Q4H  . DISCONTD: losartan  100 mg Oral Daily  . DISCONTD: losartan  100 mg  Oral Daily  . DISCONTD: methylPREDNISolone sodium succinate  40 mg Intravenous Daily  . DISCONTD: metoCLOPramide  5 mg Oral TID AC  . DISCONTD: metoprolol  50 mg Oral BID  . DISCONTD: pantoprazole (PROTONIX) IV  40 mg Intravenous Q24H  . DISCONTD: predniSONE  40 mg Oral Daily  . DISCONTD: sodium chloride  3 mL Intravenous Q12H     ASSESSMENT AND PLAN:  1. NSTEMI/CAD: s/p DES x 1 mid RCA per Dr. Kirke Corin yesterday. Doing well. Will need dual antiplatelet therapy for one year. (ASA 81 mg po Qdaily and Plavix 75 mg po Qdaily). Continue beta blocker/Imdur. He is statin intolerant.  2. ESRD: on HD, last yesterday  3. Thickened bowel in distal ileum and sigmoid colon by CT, mild dilatation of pancreatic duct. He will need outpatient f/u with GI   4. H/o Atrial fibrillation: sinus this am. Not on long term coumadin secondary to anemia with recent transfusion.   5. HTN: On home meds. Per Nephrology.   6. HLD: statin intolerant  7. Dispo: D/C home today. F/U 2 weeks in [place] office with Dr. Kirke Corin.    Russell [name]  9/26/20136:53 AM

## 2011-11-05 NOTE — Progress Notes (Signed)
CARDIAC REHAB PHASE I   PRE:  Rate/Rhythm: 81SR  BP:  Supine:   Sitting: 188/88  Standing: 191/79   SaO2:   MODE:  Ambulation: 350 ft   POST:  Rate/Rhythem: 83SR  BP:  Supine:   Sitting: 193/78  Standing:    SaO2:  0915-1005 Pt up in room packing. Stated he had called son to come get him since he was told he was going home at 0930. Told pt that his paper work would have to be completed and that I needed to talk with him about his MI. Pt appears anxious and fixated on going home so I am not sure he will remember ed. Used teach back for important points such as NTG use and plavix use. Walked with pt since he has not walked this admission in hall. No c/o CP. BP elevated. Cardiology and RN aware. Pt to receive his meds this am. Pt encouraged to stop smoking and handouts given. Stated he quit once for 3 months.   Duanne Limerick

## 2011-11-05 NOTE — Progress Notes (Signed)
PA Student Daily Progress Note [facility] Subjective: Pt states he is feeling better since dialysis. No new complaints. No acute events overnight, though his nurse notes that his dialysis graft was bleeding this morning. She applied a pressure dressing and it stopped.  Objective:  Filed Vitals:   11/05/11 0400 11/05/11 0500 11/05/11 0600 11/05/11 0700  BP: 151/66 141/45 159/78 167/74  Pulse: 76 76 72 76  Temp: 98.5 F (36.9 C)     TempSrc: Oral     Resp:      Height:      Weight:      SpO2: 97% 92% 98% 98%   Physical Exam: General Appearance:  AA Male, chronically ill appearing sitting comfortably in bed, Alert, cooperative, NAD Head: Normocephalic, atraumatic  Eyes: PERRL, conjunctiva/corneas are muddy, EOM's intact, scarring of fundi  Throat: Lips and tongue normal; teeth and gums normal, mucosa pale  Lungs: respirations unlabored, diminished breaths sounds bilaterally, crackles on left side.  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   Assessment/Plan: Mr. Russell [name] is a 61yo AA male with a PMH of CAD s/p CABG 1 yr ago, ESRD on HD following failed kidney tx in 2007, DM, COPD (current everyday smoker), HTN, and anemia.   1. ESRD on HD- pt had HD yesterday with  2.4L removed  - will continue to monitor CBC and CMET    2. S/P NSTEMI- pt had PCI with stent placement today   - Continue per cardiology   3. Anemia- pts hg is stable today at 11.1   - will continue to monitor CBC   4 HPTH- pt with previous diagnosis  - will continue to monitor   5 Debillitation  Labs: Basic Metabolic Panel:  Lab 11/05/11 1610 11/04/11 1918 11/04/11 0512  NA 139  135 138  K 3.7 5.4* 4.9  CL 98 92* 95*  CO2 27 25 28   GLUCOSE 125* 135* 113*  BUN 29* 67* 59*  CREATININE 4.85* 8.53* 7.52*  CALCIUM 9.4 9.5 9.3  ALB -- -- --  PHOS -- 7.1* --   Liver Function Tests:  Lab 11/04/11 1918 11/03/11 2315  AST 16 18  ALT 13 12  ALKPHOS 100 122*  BILITOT 0.3 0.3  PROT 5.8* 5.9*  ALBUMIN 3.0* 3.0*    CBC:  Lab 11/05/11 0510 11/04/11 1918 11/03/11 2315  WBC 6.9 6.5 7.1  NEUTROABS -- -- 6.2  HGB 11.1* 10.9* 11.3*  HCT 34.0* 33.6* 34.3*  MCV 97.7 96.8 96.3  PLT 147* 147* 148*    CBG:  Lab 11/03/11 2134  GLUCAP 142*    Micro Results: Recent Results (from the past 240 hour(s))  MRSA PCR SCREENING     Status: Normal   Collection Time   11/03/11  9:15 PM      Component Value Range Status Comment   MRSA by PCR NEGATIVE  NEGATIVE Final    Medications: Scheduled Meds:   . amLODipine  5 mg Oral QHS  . aspirin EC  325 mg Oral Daily  . bivalirudin      . clopidogrel  75 mg Oral Q breakfast  . darbepoetin (ARANESP) injection - DIALYSIS  25 mcg Intravenous Q Wed-HD  . docusate sodium  100 mg  Oral BID  . fentaNYL      . Fluticasone-Salmeterol  1 puff Inhalation BID  . heparin      . heparin  40 Units/kg Dialysis Once in dialysis  . hydrALAZINE  50 mg Oral TID  . ipratropium  500 mcg Nebulization Q4H  . isosorbide mononitrate  60 mg Oral Daily  . lanthanum  2,000 mg Oral BID WC  . levalbuterol  1.25 mg Nebulization Q4H  . lidocaine      . losartan  100 mg Oral QHS  . metoCLOPramide  5 mg Oral TID AC  . metoprolol tartrate  50 mg Oral BID  . midazolam      . multivitamin  1 tablet Oral Daily  . nitroGLYCERIN      . pantoprazole  40 mg Oral Daily  . paricalcitol  6 mcg Intravenous 3 times weekly  . predniSONE  40 mg Oral Q breakfast  . senna-docusate  1 tablet Oral BID  . sevelamer  800 mg Oral TID WC  . DISCONTD: amLODipine  5 mg Oral Daily  . DISCONTD: aspirin EC  81 mg Oral Daily  . DISCONTD: cholecalciferol  2,000 Units Oral  Daily  . DISCONTD: Cholecalciferol  2 capsule Oral Daily  . DISCONTD: clopidogrel  75 mg Oral Daily  . DISCONTD: docusate sodium  100 mg Oral BID  . DISCONTD: Fluticasone-Salmeterol  1 puff Inhalation BID  . DISCONTD: hydrALAZINE  25 mg Oral Q8H  . DISCONTD: ipratropium  500 mcg Nebulization Q4H  . DISCONTD: isosorbide mononitrate  60 mg Oral Daily  . DISCONTD: levalbuterol  1.25 mg Nebulization Q4H  . DISCONTD: losartan  100 mg Oral Daily  . DISCONTD: losartan  100 mg Oral Daily  . DISCONTD: methylPREDNISolone sodium succinate  40 mg Intravenous Daily  . DISCONTD: metoCLOPramide  5 mg Oral TID AC  . DISCONTD: metoprolol  50 mg Oral BID  . DISCONTD: pantoprazole (PROTONIX) IV  40 mg Intravenous Q24H  . DISCONTD: predniSONE  40 mg Oral Daily  . DISCONTD: sodium chloride  3 mL Intravenous Q12H   Continuous Infusions:   . DISCONTD: nitroGLYCERIN 30 mcg/min (11/04/11 0100)   PRN Meds:.sodium chloride, sodium chloride, acetaminophen, albuterol, alteplase, diphenhydrAMINE, feeding supplement (NEPRO CARB STEADY), heparin, HYDROcodone-acetaminophen, lidocaine, lidocaine-prilocaine, morphine, nitroGLYCERIN, ondansetron, pentafluoroprop-tetrafluoroeth, senna, DISCONTD: sodium chloride, DISCONTD: acetaminophen, DISCONTD:  morphine injection, DISCONTD: nitroGLYCERIN, DISCONTD: ondansetron (ZOFRAN) IV, DISCONTD: sodium chloride   This is a Psychologist, occupational Note.  The care of the patient was discussed with Dr. Darrick Penna and the assessment and plan formulated with their assistance.  Please see their attached note for official documentation of the daily encounter.  Aniceto Boss, MA, PA-S2 11/05/2011, 7:52 AM   I have seen and examined this patient and agree with the plan of care  Seen and eval and counseled.  Will contact [facility] and make rec .  Emilyrose Darrah L 11/05/2011, 9:29 AM

## 2011-11-05 NOTE — Discharge Summary (Signed)
Patient ID: Russell [name],  MRN: [id number], DOB/AGE: 1950/11/11 61 y.o.  Admit date: 11/03/2011 Discharge date: 11/05/2011  Primary Cardiologist: M. Kirke Corin, MD  Discharge Diagnoses Principal Problem:  *NSTEMI (non-ST elevated myocardial infarction)  **s/p PCI/DES to native RCA this admission. Active Problems:  ESRD (end stage renal disease)  **MWF dialysis  Coronary atherosclerosis of native coronary artery  HTN (hypertension)  COPD (chronic obstructive pulmonary disease)  Diabetes mellitus  Abnormal abdominal CT scan  [facility] CT showed mild bowel thickeneing of the segment of the distal ileum and sigmoid colon, mild dilatation of the main pancreatic duct with no obvious mass but study limited by anasarca - Outpt GI f/u in [place] recommended.  Allergies Allergies  Allergen Reactions  . Statins     Procedures  Percutaneous Coronary Intervention 11/04/2011  Vessel - mid RCA/Segment - 2 Percent Stenosis (pre)   99% TIMI-flow 2 Stent 2.75 x 18 mm Xience expedition which was postdilated with a 3.0 noncompliant balloon _____________  History of Present Illness  61 y/o male with the above problem list.  He is s/p CABG x 3 last year.  He was admitted to [facility] in [place] earlier this week with chest pain and ruled in for NSTEMI.  He underwent diagnostic catheterization revealing 3/3 patent grafts with severe, native, multivessel CAD, including 99% mid RCA stenosis.  Initially, medical therapy was attempted however pt continued to have chest pain despite IV ntg and decision was made to transfer to [facility] for further evaluation and attempted PCI.  Of note, pt underwent abdominal CT @ [facility] which showed mild bowel thickeneing of the segment of the distal ileum and sigmoid colon, mild dilatation of the main pancreatic duct with no obvious mass but study limited by anasarca.  Outpt GI f/u has been recommended.  Hospital Course  Following arrival to [facility], pt had no further chest pain.   He was taken to the cath lab on 9/25 and underwent successful PCI and DES to the native RCA.  He tolerated this procedure well and post-procedure has been ambulating without recurrent symptoms or limitations.  He has been followed by nephrology here and underwent dialysis on 9/25.  He will be discharged home today in good condition.  Discharge Vitals Blood pressure 167/74, pulse 76, temperature 98.5 F (36.9 C), temperature source Oral, resp. rate 16, height 5\' 9"  (1.753 m), weight 163 lb 12.8 oz (74.3 kg), SpO2 98.00%.  [facility] Weights   11/03/11 2116 11/04/11 1915  Weight: 164 lb 10.9 oz (74.7 kg) 163 lb 12.8 oz (74.3 kg)   Labs  CBC  Basename 11/05/11 0510 11/04/11 1918 11/03/11 2315  WBC 6.9 6.5 --  NEUTROABS -- -- 6.2  HGB 11.1* 10.9* --  HCT 34.0* 33.6* --  MCV 97.7 96.8 --  PLT 147* 147* --   Basic Metabolic Panel  Basename 11/05/11 0510 11/04/11 1918 11/03/11 2315  NA 139 135 --  K 3.7 5.4* --  CL 98 92* --  CO2 27 25 --  GLUCOSE 125* 135* --  BUN 29* 67* --  CREATININE 4.85* 8.53* --  CALCIUM 9.4 9.5 --  MG -- -- 2.1  PHOS -- 7.1* --   Liver Function Tests  Basename 11/04/11 1918 11/03/11 2315  AST 16 18  ALT 13 12  ALKPHOS 100 122*  BILITOT 0.3 0.3  PROT 5.8* 5.9*  ALBUMIN 3.0* 3.0*   Thyroid Function Tests  Basename 11/03/11 2315  TSH 2.786  T4TOTAL --  T3FREE --  THYROIDAB --  Disposition  Pt is being discharged home today in good condition.  Follow-up Plans & Appointments      Follow-up Information    Follow up with Lorine Bears, MD. On 11/24/2011. (10:30)    Contact information:   [address] [place] [address] [phone]        Discharge Medications    Medication List     As of 11/05/2011 10:01 AM    STOP taking these medications         acetaminophen 325 MG tablet   Commonly known as: TYLENOL      cetirizine 10 MG tablet   Commonly known as: ZYRTEC      diphenhydrAMINE 25 mg capsule   Commonly  known as: BENADRYL      hydrALAZINE 20 MG/ML injection   Commonly known as: APRESOLINE      ipratropium 0.02 % nebulizer solution   Commonly known as: ATROVENT      levalbuterol 1.25 MG/0.5ML nebulizer solution   Commonly known as: XOPENEX      methylPREDNISolone sodium succinate 40 mg/mL injection   Commonly known as: SOLU-MEDROL      metoCLOPramide 5 MG tablet   Commonly known as: REGLAN      morphine 2 MG/ML injection      NITROGLYCERIN IV      ondansetron 4 MG/2ML Soln injection   Commonly known as: ZOFRAN      pantoprazole 40 MG tablet   Commonly known as: PROTONIX      TAKE these medications         albuterol 108 (90 BASE) MCG/ACT inhaler   Commonly known as: PROVENTIL HFA;VENTOLIN HFA   Inhale 2 puffs into the lungs every 6 (six) hours as needed. Wheezing or shortness of breath      amLODipine 5 MG tablet   Commonly known as: NORVASC   Take 1 tablet (5 mg total) by mouth at bedtime.      aspirin 81 MG EC tablet   Take 1 tablet (81 mg total) by mouth daily.      b complex-vitamin c-folic acid 0.8 MG Tabs   Take 0.8 mg by mouth every evening.      Cholecalciferol 10000 UNITS Caps   Take 2 capsules by mouth.      clopidogrel 75 MG tablet   Commonly known as: PLAVIX   Take 1 tablet (75 mg total) by mouth daily.      docusate sodium 100 MG capsule   Commonly known as: COLACE   Take 100 mg by mouth 2 (two) times daily. Stool softener      Fluticasone-Salmeterol 250-50 MCG/DOSE Aepb   Commonly known as: ADVAIR   Inhale 1 puff into the lungs 2 (two) times daily.      hydrALAZINE 50 MG tablet   Commonly known as: APRESOLINE   Take 50 mg by mouth 3 (three) times daily.      HYDROcodone-acetaminophen 5-325 MG per tablet   Commonly known as: NORCO/VICODIN   Take 1 tablet by mouth every 6 (six) hours as needed. pain      isosorbide mononitrate 60 MG 24 hr tablet   Commonly known as: IMDUR   Take 1 tablet (60 mg total) by mouth daily.      lanthanum 500  MG chewable tablet   Commonly known as: FOSRENOL   Chew 2,000 mg by mouth 2 (two) times daily with a meal.      losartan 100 MG tablet   Commonly  known as: COZAAR   Take 100 mg by mouth daily.      metoprolol 50 MG tablet   Commonly known as: LOPRESSOR   Take 1 tablet (50 mg total) by mouth 2 (two) times daily.      nitroGLYCERIN 0.4 MG SL tablet   Commonly known as: NITROSTAT   Place 1 tablet (0.4 mg total) under the tongue every 5 (five) minutes as needed. Chest pain      predniSONE 10 MG tablet   Commonly known as: DELTASONE   3 tabs daily x 3 day, 2 tabs daily x 3 days, 1 tab daily x 3 days.      senna 8.6 MG Tabs   Commonly known as: SENOKOT   Take 2 tablets by mouth daily as needed. constipation      sevelamer 400 MG tablet   Commonly known as: RENAGEL   Take 400 mg by mouth 3 (three) times daily with meals.       Outstanding Labs/Studies  He will require outpt GI f/u in [place] 2/2 abnl CT @ [facility].  Duration of Discharge Encounter   Greater than 30 minutes including physician time.  Signed, Nicolasa Ducking NP 11/05/2011, 10:01 AM

## 2011-11-14 ENCOUNTER — Inpatient Hospital Stay: Payer: Self-pay | Admitting: Internal Medicine

## 2011-11-14 LAB — COMPREHENSIVE METABOLIC PANEL
Albumin: 3.5 g/dL (ref 3.4–5.0)
Alkaline Phosphatase: 105 U/L (ref 50–136)
Anion Gap: 14 (ref 7–16)
BUN: 40 mg/dL — ABNORMAL HIGH (ref 7–18)
Bilirubin,Total: 0.5 mg/dL (ref 0.2–1.0)
Creatinine: 5.96 mg/dL — ABNORMAL HIGH (ref 0.60–1.30)
EGFR (Non-African Amer.): 9 — ABNORMAL LOW
Glucose: 91 mg/dL (ref 65–99)
Osmolality: 296 (ref 275–301)
Potassium: 4.5 mmol/L (ref 3.5–5.1)
Sodium: 144 mmol/L (ref 136–145)
Total Protein: 7 g/dL (ref 6.4–8.2)

## 2011-11-14 LAB — CBC WITH DIFFERENTIAL/PLATELET
Basophil #: 0 10*3/uL (ref 0.0–0.1)
Eosinophil #: 0 10*3/uL (ref 0.0–0.7)
HCT: 31.6 % — ABNORMAL LOW (ref 40.0–52.0)
HGB: 10.5 g/dL — ABNORMAL LOW (ref 13.0–18.0)
Lymphocyte #: 0.3 10*3/uL — ABNORMAL LOW (ref 1.0–3.6)
Lymphocyte %: 8.6 %
MCH: 32.7 pg (ref 26.0–34.0)
MCV: 99 fL (ref 80–100)
Monocyte %: 11.5 %
Platelet: 142 10*3/uL — ABNORMAL LOW (ref 150–440)
RBC: 3.21 10*6/uL — ABNORMAL LOW (ref 4.40–5.90)
RDW: 17.7 % — ABNORMAL HIGH (ref 11.5–14.5)
WBC: 3.9 10*3/uL (ref 3.8–10.6)

## 2011-11-14 LAB — PRO B NATRIURETIC PEPTIDE: B-Type Natriuretic Peptide: 33564 pg/mL — ABNORMAL HIGH (ref 0–125)

## 2011-11-14 LAB — TROPONIN I: Troponin-I: 0.07 ng/mL — ABNORMAL HIGH

## 2011-11-14 LAB — CK TOTAL AND CKMB (NOT AT ARMC): CK, Total: 184 U/L (ref 35–232)

## 2011-11-15 LAB — CBC WITH DIFFERENTIAL/PLATELET
Basophil #: 0 10*3/uL (ref 0.0–0.1)
Eosinophil #: 0 10*3/uL (ref 0.0–0.7)
Eosinophil %: 0 %
HGB: 10.3 g/dL — ABNORMAL LOW (ref 13.0–18.0)
Lymphocyte #: 0.2 10*3/uL — ABNORMAL LOW (ref 1.0–3.6)
Lymphocyte %: 5.9 %
MCH: 32.9 pg (ref 26.0–34.0)
MCHC: 33.7 g/dL (ref 32.0–36.0)
Neutrophil %: 83.7 %
Platelet: 133 10*3/uL — ABNORMAL LOW (ref 150–440)
RDW: 17.6 % — ABNORMAL HIGH (ref 11.5–14.5)
WBC: 3.5 10*3/uL — ABNORMAL LOW (ref 3.8–10.6)

## 2011-11-15 LAB — BASIC METABOLIC PANEL
BUN: 24 mg/dL — ABNORMAL HIGH (ref 7–18)
Calcium, Total: 8.9 mg/dL (ref 8.5–10.1)
Chloride: 102 mmol/L (ref 98–107)
Co2: 30 mmol/L (ref 21–32)
Creatinine: 4.37 mg/dL — ABNORMAL HIGH (ref 0.60–1.30)
EGFR (African American): 16 — ABNORMAL LOW
EGFR (Non-African Amer.): 14 — ABNORMAL LOW
Potassium: 4.6 mmol/L (ref 3.5–5.1)

## 2011-11-15 LAB — TROPONIN I: Troponin-I: 0.06 ng/mL — ABNORMAL HIGH

## 2011-11-15 LAB — CK TOTAL AND CKMB (NOT AT ARMC): CK-MB: 3.9 ng/mL — ABNORMAL HIGH (ref 0.5–3.6)

## 2011-11-18 LAB — CBC WITH DIFFERENTIAL/PLATELET
Basophil #: 0 10*3/uL (ref 0.0–0.1)
Eosinophil #: 0 10*3/uL (ref 0.0–0.7)
Eosinophil %: 0 %
HCT: 34.2 % — ABNORMAL LOW (ref 40.0–52.0)
HGB: 11.3 g/dL — ABNORMAL LOW (ref 13.0–18.0)
Lymphocyte #: 0.3 10*3/uL — ABNORMAL LOW (ref 1.0–3.6)
Lymphocyte %: 4.1 %
MCH: 32.3 pg (ref 26.0–34.0)
Monocyte %: 5.3 %
Neutrophil %: 90.3 %
Platelet: 144 10*3/uL — ABNORMAL LOW (ref 150–440)
RBC: 3.5 10*6/uL — ABNORMAL LOW (ref 4.40–5.90)
RDW: 17.2 % — ABNORMAL HIGH (ref 11.5–14.5)
WBC: 6.5 10*3/uL (ref 3.8–10.6)

## 2011-11-18 LAB — RENAL FUNCTION PANEL
Albumin: 3.1 g/dL — ABNORMAL LOW (ref 3.4–5.0)
Anion Gap: 13 (ref 7–16)
Calcium, Total: 8.4 mg/dL — ABNORMAL LOW (ref 8.5–10.1)
Chloride: 99 mmol/L (ref 98–107)
Co2: 27 mmol/L (ref 21–32)
Creatinine: 5.35 mg/dL — ABNORMAL HIGH (ref 0.60–1.30)
EGFR (Non-African Amer.): 11 — ABNORMAL LOW
Osmolality: 290 (ref 275–301)
Sodium: 139 mmol/L (ref 136–145)

## 2011-11-20 LAB — CULTURE, BLOOD (SINGLE)

## 2011-11-20 LAB — PHOSPHORUS: Phosphorus: 5.2 mg/dL — ABNORMAL HIGH (ref 2.5–4.9)

## 2011-11-20 LAB — TROPONIN I
Troponin-I: 0.04 ng/mL
Troponin-I: 0.04 ng/mL

## 2011-11-20 LAB — HEMOGLOBIN: HGB: 12.1 g/dL — ABNORMAL LOW (ref 13.0–18.0)

## 2011-11-21 LAB — TROPONIN I: Troponin-I: 0.04 ng/mL

## 2011-11-22 LAB — PLATELET COUNT: Platelet: 134 10*3/uL — ABNORMAL LOW (ref 150–440)

## 2011-11-23 ENCOUNTER — Encounter: Payer: Medicare Other | Admitting: Cardiovascular Disease

## 2011-11-23 LAB — CBC WITH DIFFERENTIAL/PLATELET
Basophil #: 0 10*3/uL (ref 0.0–0.1)
Basophil %: 0.3 %
Eosinophil #: 0 10*3/uL (ref 0.0–0.7)
Eosinophil %: 0 %
HGB: 12.1 g/dL — ABNORMAL LOW (ref 13.0–18.0)
Lymphocyte #: 0.2 10*3/uL — ABNORMAL LOW (ref 1.0–3.6)
MCH: 33.8 pg (ref 26.0–34.0)
MCHC: 33.8 g/dL (ref 32.0–36.0)
MCV: 100 fL (ref 80–100)
Monocyte #: 0.2 x10 3/mm (ref 0.2–1.0)
Monocyte %: 3.5 %
Neutrophil %: 93.6 %
Platelet: 141 10*3/uL — ABNORMAL LOW (ref 150–440)
RBC: 3.58 10*6/uL — ABNORMAL LOW (ref 4.40–5.90)
RDW: 17.1 % — ABNORMAL HIGH (ref 11.5–14.5)
WBC: 6.8 10*3/uL (ref 3.8–10.6)

## 2011-11-23 LAB — PHOSPHORUS: Phosphorus: 7.1 mg/dL — ABNORMAL HIGH (ref 2.5–4.9)

## 2011-11-24 ENCOUNTER — Encounter: Payer: Medicare Other | Admitting: Cardiovascular Disease

## 2011-11-24 LAB — PHOSPHORUS: Phosphorus: 5.5 mg/dL — ABNORMAL HIGH (ref 2.5–4.9)

## 2011-11-27 ENCOUNTER — Observation Stay: Payer: Self-pay | Admitting: Internal Medicine

## 2011-11-27 LAB — COMPREHENSIVE METABOLIC PANEL
Anion Gap: 16 (ref 7–16)
Bilirubin,Total: 0.7 mg/dL (ref 0.2–1.0)
Chloride: 99 mmol/L (ref 98–107)
Co2: 26 mmol/L (ref 21–32)
Creatinine: 7.05 mg/dL — ABNORMAL HIGH (ref 0.60–1.30)
EGFR (African American): 9 — ABNORMAL LOW
EGFR (Non-African Amer.): 8 — ABNORMAL LOW
SGOT(AST): 14 U/L — ABNORMAL LOW (ref 15–37)
SGPT (ALT): 27 U/L (ref 12–78)

## 2011-11-27 LAB — CK-MB: CK-MB: 6.5 ng/mL — ABNORMAL HIGH (ref 0.5–3.6)

## 2011-11-27 LAB — CBC WITH DIFFERENTIAL/PLATELET
Basophil %: 0.6 %
Eosinophil #: 0.1 10*3/uL (ref 0.0–0.7)
Eosinophil %: 1.3 %
HCT: 37.9 % — ABNORMAL LOW (ref 40.0–52.0)
HGB: 12.9 g/dL — ABNORMAL LOW (ref 13.0–18.0)
Lymphocyte #: 0.5 10*3/uL — ABNORMAL LOW (ref 1.0–3.6)
Lymphocyte %: 6.4 %
MCH: 33.9 pg (ref 26.0–34.0)
MCHC: 34.1 g/dL (ref 32.0–36.0)
MCV: 99 fL (ref 80–100)
Monocyte #: 0.6 x10 3/mm (ref 0.2–1.0)
Neutrophil #: 6.8 10*3/uL — ABNORMAL HIGH (ref 1.4–6.5)
Neutrophil %: 83.9 %
RBC: 3.82 10*6/uL — ABNORMAL LOW (ref 4.40–5.90)

## 2011-11-27 LAB — MAGNESIUM: Magnesium: 2.3 mg/dL

## 2011-11-27 LAB — CK TOTAL AND CKMB (NOT AT ARMC)
CK, Total: 67 U/L (ref 35–232)
CK-MB: 7.7 ng/mL — ABNORMAL HIGH (ref 0.5–3.6)

## 2011-11-27 LAB — LIPASE, BLOOD: Lipase: 1644 U/L — ABNORMAL HIGH (ref 73–393)

## 2011-11-27 LAB — TROPONIN I: Troponin-I: 0.14 ng/mL — ABNORMAL HIGH

## 2011-11-28 LAB — LIPASE, BLOOD: Lipase: 555 U/L — ABNORMAL HIGH (ref 73–393)

## 2011-11-28 LAB — TROPONIN I: Troponin-I: 0.24 ng/mL — ABNORMAL HIGH

## 2011-12-07 ENCOUNTER — Ambulatory Visit: Payer: Medicare Other | Admitting: Cardiovascular Disease

## 2011-12-10 ENCOUNTER — Encounter: Payer: Medicare Other | Admitting: Cardiovascular Disease

## 2011-12-15 ENCOUNTER — Ambulatory Visit (INDEPENDENT_AMBULATORY_CARE_PROVIDER_SITE_OTHER): Payer: Medicare Other | Admitting: Cardiovascular Disease

## 2011-12-15 ENCOUNTER — Encounter: Payer: Self-pay | Admitting: Cardiovascular Disease

## 2011-12-15 VITALS — BP 180/80 | HR 88 | Ht 69.0 in | Wt 152.8 lb

## 2011-12-15 DIAGNOSIS — E785 Hyperlipidemia, unspecified: Secondary | ICD-10-CM

## 2011-12-15 DIAGNOSIS — I10 Essential (primary) hypertension: Secondary | ICD-10-CM

## 2011-12-15 DIAGNOSIS — R079 Chest pain, unspecified: Secondary | ICD-10-CM

## 2011-12-15 DIAGNOSIS — I2510 Atherosclerotic heart disease of native coronary artery without angina pectoris: Secondary | ICD-10-CM

## 2011-12-15 DIAGNOSIS — R0602 Shortness of breath: Secondary | ICD-10-CM

## 2011-12-15 MED ORDER — AMLODIPINE BESYLATE 10 MG PO TABS: 10.0000 mg | ORAL_TABLET | Freq: Every day | ORAL | Status: AC

## 2011-12-15 MED ORDER — CLOPIDOGREL BISULFATE 75 MG PO TABS: 75.0000 mg | ORAL_TABLET | Freq: Every day | ORAL | Status: AC

## 2011-12-15 NOTE — Assessment & Plan Note (Signed)
The patient is intolerant to statins.   

## 2011-12-15 NOTE — Progress Notes (Signed)
HPI  This is a 61 year old African American male who is here today for a followup after an RCA PCI with drug-eluting stent placement in September of this year. He has multiple chronic medical conditions that include end-stage renal disease on hemodialysis, hypertension and hyperlipidemia intolerant to statins due to severe myalgia. He has known history of coronary artery disease status post CABG. He is followed by Dr. Gwen Pounds.. he presented in September with unstable angina. He underwent cardiac catheterization which showed patent grafts. However, the native RCA had a 99% mid stenosis and was not bypassed. The left circumflex had a significant 80% disease beyond the bypassed graft with significant and heavily calcified left main disease. I performed angioplasty and drug-eluting stent placement to the RCA with significant improvement in his angina. He was hospitalized at [facility] recently for abdominal pain and pancreatitis. For some reason, he has not been taking Plavix. He mentions that he did not get enough refills. Unfortunately, he continues to smoke one pack per day.  Allergies  Allergen Reactions  . Statins     Severe myalgia (tried 4 different medications)     Current Outpatient Prescriptions on File Prior to Visit  Medication Sig Dispense Refill  . albuterol (PROVENTIL HFA;VENTOLIN HFA) 108 (90 BASE) MCG/ACT inhaler Inhale 2 puffs into the lungs every 6 (six) hours as needed. Wheezing or shortness of breath      . aspirin EC 81 MG EC tablet Take 1 tablet (81 mg total) by mouth daily.  30 tablet    . b complex-vitamin c-folic acid (NEPHRO-VITE) 0.8 MG TABS Take 0.8 mg by mouth every evening.      . Cholecalciferol 10000 UNITS CAPS Take 2 capsules by mouth.      . Fluticasone-Salmeterol (ADVAIR) 250-50 MCG/DOSE AEPB Inhale 1 puff into the lungs 2 (two) times daily.      . hydrALAZINE (APRESOLINE) 50 MG tablet Take 50 mg by mouth 3 (three) times daily.      [name] HYDROcodone-acetaminophen  (NORCO/VICODIN) 5-325 MG per tablet Take 1 tablet by mouth every 6 (six) hours as needed. pain      . isosorbide mononitrate (IMDUR) 60 MG 24 hr tablet Take 1 tablet (60 mg total) by mouth daily.  30 tablet  6  . losartan (COZAAR) 100 MG tablet Take 100 mg by mouth daily.      . metoprolol (LOPRESSOR) 50 MG tablet Take 1 tablet (50 mg total) by mouth 2 (two) times daily.  60 tablet  6  . nitroGLYCERIN (NITROSTAT) 0.4 MG SL tablet Place 1 tablet (0.4 mg total) under the tongue every 5 (five) minutes as needed. Chest pain  25 tablet  3  . sevelamer (RENAGEL) 400 MG tablet Take 400 mg by mouth 3 (three) times daily with meals.         Past Medical History  Diagnosis Date  . History of pneumonia   . GERD (gastroesophageal reflux disease)   . Chronic kidney disease     ESRD on HD  . COPD (chronic obstructive pulmonary disease)   . Anemia     chronic disease  . Thrombocytopenia   . Secondary hyperparathyroidism of renal origin   . Dysrhythmia     atrial fibrillation  . Shingles   . Arthritis     right shoulder  . Diabetes mellitus 11/05/2011  . Pancreatitis   . Coronary artery disease 2012    a. 2012 S/P CABG x 3: LIMA to LAD, SVG to D1, SVG to OM1;  b. 10/2011 NSTEMI/Cath/PCI: 3/3 patent grafts with 3VD and 99 mid RCA-> 2.75x18 Xience DES. Significant mid left circumflex stenosis beyond the bypassed OM, heavily calcified left main disease.  [name] Hyperlipidemia     a. intolerant to statins.  . Hypertension      Past Surgical History  Procedure Date  . Coronary angioplasty   . Coronary artery bypass graft   . Appendectomy   . Eye surgery     cataract surgery  . Transplantation renal     Failed     History reviewed. No pertinent family history.   History   Social History  . Marital Status: Single    Spouse Name: N/A    Number of Children: N/A  . Years of Education: N/A   Occupational History  . Not on file.   Social History Main Topics  . Smoking status: Current Every  Day Smoker -- 1.0 packs/day    Types: Cigarettes  . Smokeless tobacco: Not on file  . Alcohol Use: No  . Drug Use: No  . Sexually Active: Yes   Other Topics Concern  . Not on file   Social History Narrative  . No narrative on file     PHYSICAL EXAM   BP 180/80  Pulse 88  Ht 5\' 9"  (1.753 m)  Wt 152 lb 12 oz (69.287 kg)  BMI 22.56 kg/m2 Constitutional: He is oriented to person, place, and time. He appears well-developed and well-nourished. No distress. Heavy tobacco smell. HENT: No nasal discharge.  Head: Normocephalic and atraumatic.  Eyes: Pupils are equal and round. Right eye exhibits no discharge. Left eye exhibits no discharge.  Neck: Normal range of motion. Neck supple. Mild JVD present. No thyromegaly present.  Cardiovascular: Normal rate, regular rhythm, normal heart sounds and. Exam reveals no gallop and no friction rub. 2/6 systolic ejection murmur at the aortic area.  Pulmonary/Chest: Effort normal and breath sounds normal. No stridor. No respiratory distress. He has no wheezes. He has no rales. He exhibits no tenderness.  Abdominal: Soft. Bowel sounds are normal. He exhibits mild distension. There is no tenderness. There is no rebound and no guarding.  Musculoskeletal: Normal range of motion. He exhibits +1 edema and no tenderness.  Neurological: He is alert and oriented to person, place, and time. Coordination normal.  Skin: Skin is warm and dry. No rash noted. He is not diaphoretic. No erythema. No pallor.  Psychiatric: He has a normal mood and affect. His behavior is normal. Judgment and thought content normal.       EKG: Sinus  Rhythm  WITHIN NORMAL LIMITS   ASSESSMENT AND PLAN

## 2011-12-15 NOTE — Patient Instructions (Addendum)
Resume Plavix 75 mg once daily.  Increase Amlodipine to 10 mg once daily.  Follow up with Dr. Gwen Pounds in 3 months.

## 2011-12-15 NOTE — Assessment & Plan Note (Signed)
His blood pressure is elevated. I would increase amlodipine to 10 mg once daily.

## 2011-12-15 NOTE — Assessment & Plan Note (Signed)
The patient seems to be doing reasonably well. He has not had any significant anginal symptoms after RCA PCI with drug-eluting stent placement in September. I am very concerned that he has not been taking his Plavix regularly. This was discussed with him extensively. I sent him enough refills to his pharmacy. The patient Is here baically as a post PCI followup. He will resume his cardiac followup care with Dr. Gwen Pounds. I asked him to followup with him in 3 months.

## 2011-12-16 ENCOUNTER — Other Ambulatory Visit: Payer: Self-pay | Admitting: Cardiovascular Disease

## 2011-12-16 NOTE — Telephone Encounter (Signed)
Error

## 2011-12-28 ENCOUNTER — Inpatient Hospital Stay: Payer: Self-pay | Admitting: Student

## 2011-12-28 LAB — COMPREHENSIVE METABOLIC PANEL
Anion Gap: 10 (ref 7–16)
Calcium, Total: 8.2 mg/dL — ABNORMAL LOW (ref 8.5–10.1)
Chloride: 103 mmol/L (ref 98–107)
Co2: 27 mmol/L (ref 21–32)
EGFR (African American): 23 — ABNORMAL LOW
EGFR (Non-African Amer.): 20 — ABNORMAL LOW
Osmolality: 278 (ref 275–301)
Potassium: 3 mmol/L — ABNORMAL LOW (ref 3.5–5.1)
SGOT(AST): 27 U/L (ref 15–37)
Sodium: 140 mmol/L (ref 136–145)

## 2011-12-28 LAB — APTT: Activated PTT: 34 secs (ref 23.6–35.9)

## 2011-12-28 LAB — TROPONIN I: Troponin-I: 2.4 ng/mL — ABNORMAL HIGH

## 2011-12-28 LAB — CBC
HCT: 26.6 % — ABNORMAL LOW (ref 40.0–52.0)
MCH: 33.1 pg (ref 26.0–34.0)
MCV: 98 fL (ref 80–100)
Platelet: 143 10*3/uL — ABNORMAL LOW (ref 150–440)
RBC: 2.72 10*6/uL — ABNORMAL LOW (ref 4.40–5.90)
RDW: 16.2 % — ABNORMAL HIGH (ref 11.5–14.5)

## 2011-12-28 LAB — CK TOTAL AND CKMB (NOT AT ARMC)
CK, Total: 251 U/L — ABNORMAL HIGH (ref 35–232)
CK, Total: 259 U/L — ABNORMAL HIGH (ref 35–232)
CK-MB: 8.4 ng/mL — ABNORMAL HIGH (ref 0.5–3.6)
CK-MB: 13.3 ng/mL — ABNORMAL HIGH (ref 0.5–3.6)

## 2011-12-28 LAB — PROTIME-INR: Prothrombin Time: 13.1 secs (ref 11.5–14.7)

## 2011-12-29 LAB — CBC WITH DIFFERENTIAL/PLATELET
Basophil #: 0 10*3/uL (ref 0.0–0.1)
Basophil %: 1.3 %
Eosinophil #: 0.1 10*3/uL (ref 0.0–0.7)
Eosinophil %: 3.7 %
HCT: 26.5 % — ABNORMAL LOW (ref 40.0–52.0)
MCH: 33.4 pg (ref 26.0–34.0)
MCHC: 34.2 g/dL (ref 32.0–36.0)
Monocyte #: 0.6 x10 3/mm (ref 0.2–1.0)
Monocyte %: 19.8 %
Neutrophil #: 1.9 10*3/uL (ref 1.4–6.5)
Neutrophil %: 58.8 %
Platelet: 131 10*3/uL — ABNORMAL LOW (ref 150–440)
RBC: 2.71 10*6/uL — ABNORMAL LOW (ref 4.40–5.90)
RDW: 16 % — ABNORMAL HIGH (ref 11.5–14.5)

## 2011-12-29 LAB — BASIC METABOLIC PANEL
Anion Gap: 6 — ABNORMAL LOW (ref 7–16)
BUN: 22 mg/dL — ABNORMAL HIGH (ref 7–18)
Calcium, Total: 9 mg/dL (ref 8.5–10.1)
Chloride: 104 mmol/L (ref 98–107)
Co2: 33 mmol/L — ABNORMAL HIGH (ref 21–32)
EGFR (African American): 13 — ABNORMAL LOW
Glucose: 83 mg/dL (ref 65–99)
Osmolality: 287 (ref 275–301)
Sodium: 143 mmol/L (ref 136–145)

## 2011-12-29 LAB — CK TOTAL AND CKMB (NOT AT ARMC): CK-MB: 10.1 ng/mL — ABNORMAL HIGH (ref 0.5–3.6)

## 2011-12-30 LAB — POTASSIUM: Potassium: 4.3 mmol/L (ref 3.5–5.1)

## 2011-12-30 LAB — PHOSPHORUS: Phosphorus: 7.2 mg/dL — ABNORMAL HIGH (ref 2.5–4.9)

## 2012-01-01 LAB — CK TOTAL AND CKMB (NOT AT ARMC): CK, Total: 94 U/L (ref 35–232)

## 2012-01-01 LAB — PHOSPHORUS: Phosphorus: 7.5 mg/dL — ABNORMAL HIGH (ref 2.5–4.9)

## 2012-01-10 ENCOUNTER — Ambulatory Visit: Payer: Self-pay | Admitting: Internal Medicine

## 2012-01-11 ENCOUNTER — Inpatient Hospital Stay: Payer: Self-pay | Admitting: Internal Medicine

## 2012-01-11 LAB — PROTIME-INR
INR: 1
Prothrombin Time: 13.1 secs (ref 11.5–14.7)

## 2012-01-11 LAB — LIPASE, BLOOD: Lipase: 125 U/L (ref 73–393)

## 2012-01-11 LAB — CBC
HGB: 9.6 g/dL — ABNORMAL LOW (ref 13.0–18.0)
MCH: 32.6 pg (ref 26.0–34.0)
MCHC: 33.6 g/dL (ref 32.0–36.0)
MCV: 97 fL (ref 80–100)
RDW: 15.3 % — ABNORMAL HIGH (ref 11.5–14.5)
WBC: 4.3 10*3/uL (ref 3.8–10.6)

## 2012-01-11 LAB — COMPREHENSIVE METABOLIC PANEL
Alkaline Phosphatase: 79 U/L (ref 50–136)
Bilirubin,Total: 0.5 mg/dL (ref 0.2–1.0)
Calcium, Total: 8.8 mg/dL (ref 8.5–10.1)
Co2: 27 mmol/L (ref 21–32)
Creatinine: 4.15 mg/dL — ABNORMAL HIGH (ref 0.60–1.30)
EGFR (Non-African Amer.): 14 — ABNORMAL LOW
Glucose: 74 mg/dL (ref 65–99)
SGPT (ALT): 16 U/L (ref 12–78)
Total Protein: 7.1 g/dL (ref 6.4–8.2)

## 2012-01-11 LAB — MAGNESIUM: Magnesium: 2 mg/dL

## 2012-01-11 LAB — CK TOTAL AND CKMB (NOT AT ARMC): CK, Total: 246 U/L — ABNORMAL HIGH (ref 35–232)

## 2012-01-12 LAB — COMPREHENSIVE METABOLIC PANEL
Alkaline Phosphatase: 81 U/L (ref 50–136)
Anion Gap: 10 (ref 7–16)
Calcium, Total: 9.1 mg/dL (ref 8.5–10.1)
Co2: 27 mmol/L (ref 21–32)
Creatinine: 5.99 mg/dL — ABNORMAL HIGH (ref 0.60–1.30)
EGFR (Non-African Amer.): 9 — ABNORMAL LOW
Osmolality: 284 (ref 275–301)
Potassium: 4.3 mmol/L (ref 3.5–5.1)
SGOT(AST): 23 U/L (ref 15–37)
Sodium: 140 mmol/L (ref 136–145)

## 2012-01-12 LAB — PROTIME-INR: INR: 1.1

## 2012-01-12 LAB — TROPONIN I: Troponin-I: 0.57 ng/mL — ABNORMAL HIGH

## 2012-01-12 LAB — CBC WITH DIFFERENTIAL/PLATELET
Basophil %: 2.2 %
Eosinophil #: 0.3 10*3/uL (ref 0.0–0.7)
Eosinophil %: 9.9 %
HCT: 26.2 % — ABNORMAL LOW (ref 40.0–52.0)
HGB: 8.8 g/dL — ABNORMAL LOW (ref 13.0–18.0)
Lymphocyte #: 0.6 10*3/uL — ABNORMAL LOW (ref 1.0–3.6)
Lymphocyte %: 15.7 %
MCH: 33.5 pg (ref 26.0–34.0)
MCV: 100 fL (ref 80–100)
Monocyte #: 0.6 x10 3/mm (ref 0.2–1.0)
Neutrophil #: 1.9 10*3/uL (ref 1.4–6.5)
Neutrophil %: 54.8 %
RBC: 2.62 10*6/uL — ABNORMAL LOW (ref 4.40–5.90)
RDW: 15.5 % — ABNORMAL HIGH (ref 11.5–14.5)

## 2012-01-12 LAB — APTT
Activated PTT: 62.6 secs — ABNORMAL HIGH (ref 23.6–35.9)
Activated PTT: 56.9 secs — ABNORMAL HIGH (ref 23.6–35.9)
Activated PTT: 68.8 secs — ABNORMAL HIGH (ref 23.6–35.9)

## 2012-01-12 LAB — CK TOTAL AND CKMB (NOT AT ARMC): CK-MB: 6.8 ng/mL — ABNORMAL HIGH (ref 0.5–3.6)

## 2012-01-12 LAB — MAGNESIUM: Magnesium: 2 mg/dL

## 2012-01-13 LAB — CBC WITH DIFFERENTIAL/PLATELET
Basophil #: 0 10*3/uL (ref 0.0–0.1)
HGB: 9.3 g/dL — ABNORMAL LOW (ref 13.0–18.0)
Lymphocyte #: 0.3 10*3/uL — ABNORMAL LOW (ref 1.0–3.6)
MCH: 32.7 pg (ref 26.0–34.0)
Monocyte #: 0.3 x10 3/mm (ref 0.2–1.0)
Platelet: 134 10*3/uL — ABNORMAL LOW (ref 150–440)
RDW: 15.3 % — ABNORMAL HIGH (ref 11.5–14.5)
WBC: 4.6 10*3/uL (ref 3.8–10.6)

## 2012-01-13 LAB — PHOSPHORUS: Phosphorus: 8.5 mg/dL — ABNORMAL HIGH (ref 2.5–4.9)

## 2012-01-14 LAB — TROPONIN I: Troponin-I: 0.16 ng/mL — ABNORMAL HIGH

## 2012-01-15 LAB — COMPREHENSIVE METABOLIC PANEL
Albumin: 3.1 g/dL — ABNORMAL LOW (ref 3.4–5.0)
Alkaline Phosphatase: 95 U/L (ref 50–136)
Anion Gap: 7 (ref 7–16)
BUN: 25 mg/dL — ABNORMAL HIGH (ref 7–18)
Calcium, Total: 8.3 mg/dL — ABNORMAL LOW (ref 8.5–10.1)
Co2: 29 mmol/L (ref 21–32)
EGFR (Non-African Amer.): 14 — ABNORMAL LOW
Glucose: 79 mg/dL (ref 65–99)
Potassium: 4.2 mmol/L (ref 3.5–5.1)
SGOT(AST): 32 U/L (ref 15–37)
SGPT (ALT): 16 U/L (ref 12–78)
Total Protein: 6.2 g/dL — ABNORMAL LOW (ref 6.4–8.2)

## 2012-01-15 LAB — CBC WITH DIFFERENTIAL/PLATELET
Basophil #: 0.1 10*3/uL (ref 0.0–0.1)
Basophil %: 2.8 %
Eosinophil #: 0.2 10*3/uL (ref 0.0–0.7)
HCT: 28.1 % — ABNORMAL LOW (ref 40.0–52.0)
HGB: 9.4 g/dL — ABNORMAL LOW (ref 13.0–18.0)
MCH: 32.8 pg (ref 26.0–34.0)
MCHC: 33.5 g/dL (ref 32.0–36.0)
Monocyte #: 0.6 x10 3/mm (ref 0.2–1.0)
Neutrophil #: 3.3 10*3/uL (ref 1.4–6.5)
RDW: 15.2 % — ABNORMAL HIGH (ref 11.5–14.5)
WBC: 4.7 10*3/uL (ref 3.8–10.6)

## 2012-01-15 LAB — PROTIME-INR
INR: 1
Prothrombin Time: 13.2 secs (ref 11.5–14.7)

## 2012-01-15 LAB — APTT: Activated PTT: 31.4 secs (ref 23.6–35.9)

## 2012-01-15 LAB — TROPONIN I: Troponin-I: 0.08 ng/mL — ABNORMAL HIGH

## 2012-01-16 LAB — LIPID PANEL
Cholesterol: 233 mg/dL — ABNORMAL HIGH (ref 0–200)
Ldl Cholesterol, Calc: 113 mg/dL — ABNORMAL HIGH (ref 0–100)
Triglycerides: 128 mg/dL (ref 0–200)
VLDL Cholesterol, Calc: 26 mg/dL (ref 5–40)

## 2012-01-16 LAB — CBC WITH DIFFERENTIAL/PLATELET
Basophil #: 0 10*3/uL (ref 0.0–0.1)
Basophil %: 0.2 %
Eosinophil #: 0.3 10*3/uL (ref 0.0–0.7)
Eosinophil %: 6.5 %
HGB: 9.8 g/dL — ABNORMAL LOW (ref 13.0–18.0)
Lymphocyte %: 8.5 %
MCH: 33.2 pg (ref 26.0–34.0)
MCV: 98 fL (ref 80–100)
Monocyte %: 13.6 %
Neutrophil %: 71.2 %
Platelet: 147 10*3/uL — ABNORMAL LOW (ref 150–440)
RBC: 2.94 10*6/uL — ABNORMAL LOW (ref 4.40–5.90)
RDW: 15.7 % — ABNORMAL HIGH (ref 11.5–14.5)
WBC: 5.2 10*3/uL (ref 3.8–10.6)

## 2012-01-16 LAB — BASIC METABOLIC PANEL
Anion Gap: 6 — ABNORMAL LOW (ref 7–16)
Calcium, Total: 8.9 mg/dL (ref 8.5–10.1)
Co2: 34 mmol/L — ABNORMAL HIGH (ref 21–32)
Creatinine: 4.42 mg/dL — ABNORMAL HIGH (ref 0.60–1.30)
Potassium: 4.3 mmol/L (ref 3.5–5.1)
Sodium: 139 mmol/L (ref 136–145)

## 2012-01-16 LAB — TROPONIN I: Troponin-I: 0.07 ng/mL — ABNORMAL HIGH

## 2012-01-17 ENCOUNTER — Inpatient Hospital Stay: Payer: Self-pay | Admitting: Family Medicine

## 2012-01-18 LAB — TROPONIN I: Troponin-I: 0.05 ng/mL

## 2012-01-18 LAB — PHOSPHORUS: Phosphorus: 7.6 mg/dL — ABNORMAL HIGH (ref 2.5–4.9)

## 2012-02-10 ENCOUNTER — Ambulatory Visit: Payer: Self-pay | Admitting: Internal Medicine

## 2012-03-11 LAB — BASIC METABOLIC PANEL
Calcium, Total: 8.1 mg/dL — ABNORMAL LOW (ref 8.5–10.1)
Co2: 24 mmol/L (ref 21–32)
EGFR (African American): 7 — ABNORMAL LOW
EGFR (Non-African Amer.): 6 — ABNORMAL LOW
Osmolality: 296 (ref 275–301)
Potassium: 4.1 mmol/L (ref 3.5–5.1)

## 2012-03-11 LAB — CBC WITH DIFFERENTIAL/PLATELET
Basophil #: 0.1 10*3/uL (ref 0.0–0.1)
Basophil %: 1.3 %
Eosinophil #: 0.2 10*3/uL (ref 0.0–0.7)
HCT: 31.6 % — ABNORMAL LOW (ref 40.0–52.0)
HGB: 10.5 g/dL — ABNORMAL LOW (ref 13.0–18.0)
Lymphocyte #: 0.4 10*3/uL — ABNORMAL LOW (ref 1.0–3.6)
Lymphocyte %: 10.1 %
MCH: 31.6 pg (ref 26.0–34.0)
MCHC: 33.1 g/dL (ref 32.0–36.0)
MCV: 95 fL (ref 80–100)
Monocyte #: 0.5 x10 3/mm (ref 0.2–1.0)
Neutrophil #: 3.2 10*3/uL (ref 1.4–6.5)
Neutrophil %: 74.3 %
Platelet: 132 10*3/uL — ABNORMAL LOW (ref 150–440)
WBC: 4.4 10*3/uL (ref 3.8–10.6)

## 2012-03-11 LAB — TROPONIN I
Troponin-I: 0.03 ng/mL
Troponin-I: 0.03 ng/mL

## 2012-03-11 LAB — APTT: Activated PTT: 41.1 secs — ABNORMAL HIGH (ref 23.6–35.9)

## 2012-03-12 ENCOUNTER — Ambulatory Visit: Payer: Self-pay | Admitting: Internal Medicine

## 2012-03-12 LAB — BASIC METABOLIC PANEL
Anion Gap: 15 (ref 7–16)
BUN: 72 mg/dL — ABNORMAL HIGH (ref 7–18)
Calcium, Total: 8.3 mg/dL — ABNORMAL LOW (ref 8.5–10.1)
Chloride: 102 mmol/L (ref 98–107)
Co2: 24 mmol/L (ref 21–32)
Creatinine: 10.8 mg/dL — ABNORMAL HIGH (ref 0.60–1.30)
EGFR (African American): 5 — ABNORMAL LOW
Potassium: 5.2 mmol/L — ABNORMAL HIGH (ref 3.5–5.1)
Sodium: 141 mmol/L (ref 136–145)

## 2012-03-12 LAB — CK TOTAL AND CKMB (NOT AT ARMC)
CK, Total: 154 U/L (ref 35–232)
CK-MB: 5.7 ng/mL — ABNORMAL HIGH (ref 0.5–3.6)

## 2012-03-12 LAB — CBC WITH DIFFERENTIAL/PLATELET
Basophil #: 0.1 10*3/uL (ref 0.0–0.1)
Eosinophil #: 0.2 10*3/uL (ref 0.0–0.7)
HCT: 32.1 % — ABNORMAL LOW (ref 40.0–52.0)
MCV: 96 fL (ref 80–100)
Monocyte %: 13.9 %
Neutrophil #: 2.9 10*3/uL (ref 1.4–6.5)
Neutrophil %: 67.2 %
RDW: 16.4 % — ABNORMAL HIGH (ref 11.5–14.5)

## 2012-03-13 ENCOUNTER — Inpatient Hospital Stay: Payer: Self-pay | Admitting: Specialist

## 2012-03-13 LAB — BASIC METABOLIC PANEL
Anion Gap: 17 — ABNORMAL HIGH (ref 7–16)
Calcium, Total: 8.7 mg/dL (ref 8.5–10.1)
Chloride: 100 mmol/L (ref 98–107)
Creatinine: 13.55 mg/dL — ABNORMAL HIGH (ref 0.60–1.30)
EGFR (African American): 4 — ABNORMAL LOW
Osmolality: 305 (ref 275–301)
Potassium: 5.8 mmol/L — ABNORMAL HIGH (ref 3.5–5.1)
Sodium: 138 mmol/L (ref 136–145)

## 2012-03-14 LAB — BASIC METABOLIC PANEL
Calcium, Total: 9.1 mg/dL (ref 8.5–10.1)
Co2: 20 mmol/L — ABNORMAL LOW (ref 21–32)
Creatinine: 14.97 mg/dL — ABNORMAL HIGH (ref 0.60–1.30)
Osmolality: 309 (ref 275–301)

## 2012-03-14 LAB — PHOSPHORUS: Phosphorus: 14.6 mg/dL — ABNORMAL HIGH (ref 2.5–4.9)

## 2012-03-15 LAB — BASIC METABOLIC PANEL
Anion Gap: 14 (ref 7–16)
BUN: 59 mg/dL — ABNORMAL HIGH (ref 7–18)
Chloride: 96 mmol/L — ABNORMAL LOW (ref 98–107)
Creatinine: 10.1 mg/dL — ABNORMAL HIGH (ref 0.60–1.30)
EGFR (African American): 6 — ABNORMAL LOW
EGFR (Non-African Amer.): 5 — ABNORMAL LOW
Glucose: 86 mg/dL (ref 65–99)
Potassium: 4.5 mmol/L (ref 3.5–5.1)

## 2012-03-16 LAB — PHOSPHORUS: Phosphorus: 11.5 mg/dL — ABNORMAL HIGH (ref 2.5–4.9)

## 2012-03-16 LAB — CULTURE, BLOOD (SINGLE)

## 2012-03-21 ENCOUNTER — Emergency Department: Payer: Self-pay | Admitting: Emergency Medicine

## 2012-03-21 LAB — COMPREHENSIVE METABOLIC PANEL
Albumin: 2.8 g/dL — ABNORMAL LOW (ref 3.4–5.0)
Alkaline Phosphatase: 100 U/L (ref 50–136)
Chloride: 100 mmol/L (ref 98–107)
Co2: 28 mmol/L (ref 21–32)
Creatinine: 4.5 mg/dL — ABNORMAL HIGH (ref 0.60–1.30)
EGFR (African American): 15 — ABNORMAL LOW
EGFR (Non-African Amer.): 13 — ABNORMAL LOW
Osmolality: 277 (ref 275–301)
Potassium: 4.1 mmol/L (ref 3.5–5.1)
SGOT(AST): 28 U/L (ref 15–37)
SGPT (ALT): 17 U/L (ref 12–78)
Total Protein: 7 g/dL (ref 6.4–8.2)

## 2012-03-21 LAB — CBC
HGB: 9.2 g/dL — ABNORMAL LOW (ref 13.0–18.0)
MCH: 31.3 pg (ref 26.0–34.0)
MCHC: 33.1 g/dL (ref 32.0–36.0)
Platelet: 184 10*3/uL (ref 150–440)
WBC: 5 10*3/uL (ref 3.8–10.6)

## 2012-03-22 ENCOUNTER — Ambulatory Visit: Payer: Self-pay | Admitting: Internal Medicine

## 2012-04-01 ENCOUNTER — Inpatient Hospital Stay: Payer: Self-pay | Admitting: Internal Medicine

## 2012-04-01 LAB — COMPREHENSIVE METABOLIC PANEL
Alkaline Phosphatase: 105 U/L (ref 50–136)
Anion Gap: 12 (ref 7–16)
BUN: 38 mg/dL — ABNORMAL HIGH (ref 7–18)
Chloride: 100 mmol/L (ref 98–107)
Creatinine: 7.41 mg/dL — ABNORMAL HIGH (ref 0.60–1.30)
EGFR (African American): 8 — ABNORMAL LOW
EGFR (Non-African Amer.): 7 — ABNORMAL LOW
Glucose: 89 mg/dL (ref 65–99)
Potassium: 4.6 mmol/L (ref 3.5–5.1)

## 2012-04-01 LAB — CBC WITH DIFFERENTIAL/PLATELET
Basophil #: 0.1 x10 3/mm 3
Basophil %: 1.2 %
Eosinophil #: 0.2 x10 3/mm 3
Eosinophil %: 3.3 %
HCT: 30.1 % — ABNORMAL LOW
HGB: 9.6 g/dL — ABNORMAL LOW
Lymphocyte %: 6.7 %
Lymphs Abs: 0.3 x10 3/mm 3 — ABNORMAL LOW
MCH: 30.7 pg
MCHC: 31.9 g/dL — ABNORMAL LOW
MCV: 96 fL
Monocyte #: 0.6 "x10 3/mm "
Monocyte %: 11 %
Neutrophil #: 3.9 x10 3/mm 3
Neutrophil %: 77.8 %
Platelet: 174 x10 3/mm 3
RBC: 3.14 x10 6/mm 3 — ABNORMAL LOW
RDW: 17.3 % — ABNORMAL HIGH
WBC: 5.1 x10 3/mm 3

## 2012-04-01 LAB — CK-MB
CK-MB: 4.5 ng/mL — ABNORMAL HIGH
CK-MB: 3.8 ng/mL — ABNORMAL HIGH

## 2012-04-01 LAB — URINALYSIS, COMPLETE
Bilirubin,UR: NEGATIVE
Blood: NEGATIVE
Ketone: NEGATIVE
Nitrite: NEGATIVE
Protein: 100
RBC,UR: 1 /HPF (ref 0–5)
Specific Gravity: 1.011 (ref 1.003–1.030)

## 2012-04-01 LAB — PRO B NATRIURETIC PEPTIDE: B-Type Natriuretic Peptide: 27512 pg/mL — ABNORMAL HIGH (ref 0–125)

## 2012-04-01 LAB — TROPONIN I
Troponin-I: 0.04 ng/mL
Troponin-I: 0.04 ng/mL
Troponin-I: 0.04 ng/mL

## 2012-04-01 LAB — CK TOTAL AND CKMB (NOT AT ARMC): CK, Total: 94 U/L (ref 35–232)

## 2012-04-02 LAB — CBC WITH DIFFERENTIAL/PLATELET
Basophil #: 0 10*3/uL (ref 0.0–0.1)
Eosinophil %: 0 %
HCT: 29.8 % — ABNORMAL LOW (ref 40.0–52.0)
Lymphocyte #: 0.2 10*3/uL — ABNORMAL LOW (ref 1.0–3.6)
Lymphocyte %: 3.6 %
MCH: 30.9 pg (ref 26.0–34.0)
MCV: 97 fL (ref 80–100)
Monocyte #: 0.3 x10 3/mm (ref 0.2–1.0)
Neutrophil #: 4.6 10*3/uL (ref 1.4–6.5)
Neutrophil %: 90.5 %
Platelet: 173 10*3/uL (ref 150–440)
RBC: 3.08 10*6/uL — ABNORMAL LOW (ref 4.40–5.90)
RDW: 17.7 % — ABNORMAL HIGH (ref 11.5–14.5)
WBC: 5.1 10*3/uL (ref 3.8–10.6)

## 2012-04-02 LAB — BASIC METABOLIC PANEL
BUN: 31 mg/dL — ABNORMAL HIGH (ref 7–18)
Calcium, Total: 9.1 mg/dL (ref 8.5–10.1)
Chloride: 101 mmol/L (ref 98–107)
EGFR (African American): 12 — ABNORMAL LOW
Glucose: 147 mg/dL — ABNORMAL HIGH (ref 65–99)
Potassium: 5.1 mmol/L (ref 3.5–5.1)
Sodium: 141 mmol/L (ref 136–145)

## 2012-04-02 LAB — PHOSPHORUS: Phosphorus: 4.5 mg/dL (ref 2.5–4.9)

## 2012-04-03 DIAGNOSIS — R079 Chest pain, unspecified: Secondary | ICD-10-CM

## 2012-04-03 LAB — BASIC METABOLIC PANEL
Anion Gap: 9 (ref 7–16)
Calcium, Total: 9.8 mg/dL (ref 8.5–10.1)
Co2: 28 mmol/L (ref 21–32)
Creatinine: 7.44 mg/dL — ABNORMAL HIGH (ref 0.60–1.30)
EGFR (African American): 8 — ABNORMAL LOW
EGFR (Non-African Amer.): 7 — ABNORMAL LOW
Glucose: 116 mg/dL — ABNORMAL HIGH (ref 65–99)
Osmolality: 290 (ref 275–301)
Potassium: 5.1 mmol/L (ref 3.5–5.1)
Sodium: 138 mmol/L (ref 136–145)

## 2012-04-03 LAB — PHOSPHORUS: Phosphorus: 7.1 mg/dL — ABNORMAL HIGH (ref 2.5–4.9)

## 2012-04-07 LAB — CULTURE, BLOOD (SINGLE)

## 2012-04-09 ENCOUNTER — Ambulatory Visit: Payer: Self-pay | Admitting: Internal Medicine

## 2012-04-18 ENCOUNTER — Inpatient Hospital Stay: Payer: Self-pay | Admitting: Internal Medicine

## 2012-04-18 LAB — COMPREHENSIVE METABOLIC PANEL
Albumin: 3.5 g/dL (ref 3.4–5.0)
Alkaline Phosphatase: 109 U/L (ref 50–136)
Anion Gap: 15 (ref 7–16)
BUN: 91 mg/dL — ABNORMAL HIGH (ref 7–18)
Bilirubin,Total: 0.8 mg/dL (ref 0.2–1.0)
Chloride: 108 mmol/L — ABNORMAL HIGH (ref 98–107)
Co2: 17 mmol/L — ABNORMAL LOW (ref 21–32)
EGFR (Non-African Amer.): 4 — ABNORMAL LOW
Glucose: 87 mg/dL (ref 65–99)
Osmolality: 307 (ref 275–301)
Potassium: 7.2 mmol/L (ref 3.5–5.1)
SGOT(AST): 44 U/L — ABNORMAL HIGH (ref 15–37)
SGPT (ALT): 22 U/L (ref 12–78)
Sodium: 140 mmol/L (ref 136–145)

## 2012-04-18 LAB — CBC
HCT: 31.9 % — ABNORMAL LOW (ref 40.0–52.0)
HGB: 10.3 g/dL — ABNORMAL LOW (ref 13.0–18.0)
MCH: 31.3 pg (ref 26.0–34.0)
MCV: 97 fL (ref 80–100)
RDW: 19.7 % — ABNORMAL HIGH (ref 11.5–14.5)
WBC: 5.7 10*3/uL (ref 3.8–10.6)

## 2012-04-18 LAB — CK TOTAL AND CKMB (NOT AT ARMC)
CK, Total: 450 U/L — ABNORMAL HIGH (ref 35–232)
CK-MB: 27.7 ng/mL — ABNORMAL HIGH (ref 0.5–3.6)

## 2012-04-18 LAB — PROTIME-INR
INR: 1.2
Prothrombin Time: 14.9 secs — ABNORMAL HIGH (ref 11.5–14.7)

## 2012-04-19 LAB — BASIC METABOLIC PANEL
Anion Gap: 9 (ref 7–16)
BUN: 32 mg/dL — ABNORMAL HIGH (ref 7–18)
Chloride: 100 mmol/L (ref 98–107)
Co2: 32 mmol/L (ref 21–32)
Osmolality: 286 (ref 275–301)
Potassium: 3.6 mmol/L (ref 3.5–5.1)

## 2012-04-19 LAB — APTT: Activated PTT: 38.6 secs — ABNORMAL HIGH (ref 23.6–35.9)

## 2012-04-19 LAB — CK-MB: CK-MB: 11.9 ng/mL — ABNORMAL HIGH (ref 0.5–3.6)

## 2012-04-20 LAB — RENAL FUNCTION PANEL
Albumin: 2.7 g/dL — ABNORMAL LOW (ref 3.4–5.0)
BUN: 27 mg/dL — ABNORMAL HIGH (ref 7–18)
Calcium, Total: 7.9 mg/dL — ABNORMAL LOW (ref 8.5–10.1)
Chloride: 99 mmol/L (ref 98–107)
Co2: 32 mmol/L (ref 21–32)
Creatinine: 6.38 mg/dL — ABNORMAL HIGH (ref 0.60–1.30)
EGFR (African American): 10 — ABNORMAL LOW
EGFR (Non-African Amer.): 9 — ABNORMAL LOW
Glucose: 156 mg/dL — ABNORMAL HIGH (ref 65–99)
Osmolality: 284 (ref 275–301)
Phosphorus: 7.4 mg/dL — ABNORMAL HIGH (ref 2.5–4.9)
Sodium: 138 mmol/L (ref 136–145)

## 2012-04-20 LAB — CBC WITH DIFFERENTIAL/PLATELET
Basophil #: 0 10*3/uL (ref 0.0–0.1)
Basophil %: 0.9 %
Eosinophil #: 0.1 10*3/uL (ref 0.0–0.7)
HCT: 28.4 % — ABNORMAL LOW (ref 40.0–52.0)
HGB: 9.1 g/dL — ABNORMAL LOW (ref 13.0–18.0)
Lymphocyte %: 14 %
MCHC: 32 g/dL (ref 32.0–36.0)
MCV: 97 fL (ref 80–100)
Monocyte #: 0.5 x10 3/mm (ref 0.2–1.0)
Neutrophil %: 68.3 %
Platelet: 88 10*3/uL — ABNORMAL LOW (ref 150–440)
RBC: 2.94 10*6/uL — ABNORMAL LOW (ref 4.40–5.90)
RDW: 19 % — ABNORMAL HIGH (ref 11.5–14.5)
WBC: 3.3 10*3/uL — ABNORMAL LOW (ref 3.8–10.6)

## 2012-04-20 LAB — APTT: Activated PTT: 73.7 secs — ABNORMAL HIGH (ref 23.6–35.9)

## 2012-04-21 LAB — APTT
Activated PTT: 66.5 secs — ABNORMAL HIGH (ref 23.6–35.9)
Activated PTT: 34 secs (ref 23.6–35.9)

## 2012-04-21 LAB — BASIC METABOLIC PANEL
BUN: 20 mg/dL — ABNORMAL HIGH (ref 7–18)
Calcium, Total: 8.2 mg/dL — ABNORMAL LOW (ref 8.5–10.1)
Co2: 35 mmol/L — ABNORMAL HIGH (ref 21–32)
Creatinine: 4.91 mg/dL — ABNORMAL HIGH (ref 0.60–1.30)
EGFR (African American): 14 — ABNORMAL LOW
EGFR (Non-African Amer.): 12 — ABNORMAL LOW
Osmolality: 276 (ref 275–301)
Potassium: 4.4 mmol/L (ref 3.5–5.1)
Sodium: 137 mmol/L (ref 136–145)

## 2012-04-21 LAB — CBC
HCT: 29.2 % — ABNORMAL LOW (ref 40.0–52.0)
HGB: 9.8 g/dL — ABNORMAL LOW (ref 13.0–18.0)
MCH: 32.4 pg (ref 26.0–34.0)
MCHC: 33.6 g/dL (ref 32.0–36.0)
MCV: 97 fL (ref 80–100)
RBC: 3.03 10*6/uL — ABNORMAL LOW (ref 4.40–5.90)
RDW: 18.2 % — ABNORMAL HIGH (ref 11.5–14.5)
WBC: 3.3 10*3/uL — ABNORMAL LOW (ref 3.8–10.6)

## 2012-05-10 ENCOUNTER — Ambulatory Visit: Payer: Self-pay | Admitting: Internal Medicine

## 2012-05-15 LAB — CBC
HGB: 11.2 g/dL — ABNORMAL LOW (ref 13.0–18.0)
MCH: 31.4 pg (ref 26.0–34.0)
MCV: 96 fL (ref 80–100)
Platelet: 169 10*3/uL (ref 150–440)
RBC: 3.57 10*6/uL — ABNORMAL LOW (ref 4.40–5.90)
RDW: 18 % — ABNORMAL HIGH (ref 11.5–14.5)
WBC: 5.4 10*3/uL (ref 3.8–10.6)

## 2012-05-15 LAB — COMPREHENSIVE METABOLIC PANEL
Albumin: 3.8 g/dL (ref 3.4–5.0)
Alkaline Phosphatase: 106 U/L (ref 50–136)
Bilirubin,Total: 0.6 mg/dL (ref 0.2–1.0)
Chloride: 105 mmol/L (ref 98–107)
Osmolality: 295 (ref 275–301)
SGOT(AST): 24 U/L (ref 15–37)
SGPT (ALT): 18 U/L (ref 12–78)
Sodium: 142 mmol/L (ref 136–145)
Total Protein: 7.5 g/dL (ref 6.4–8.2)

## 2012-05-15 LAB — LIPASE, BLOOD: Lipase: 153 U/L (ref 73–393)

## 2012-05-16 ENCOUNTER — Inpatient Hospital Stay: Payer: Self-pay | Admitting: Internal Medicine

## 2012-05-16 LAB — URINALYSIS, COMPLETE
Bilirubin,UR: NEGATIVE
Ketone: NEGATIVE
Ph: 8 (ref 4.5–8.0)
Protein: 100
Specific Gravity: 1.014 (ref 1.003–1.030)
Squamous Epithelial: 3
WBC UR: 2 /HPF (ref 0–5)

## 2012-05-17 LAB — CBC WITH DIFFERENTIAL/PLATELET
Basophil #: 0.1 10*3/uL (ref 0.0–0.1)
Basophil %: 1.4 %
Eosinophil #: 0.3 10*3/uL (ref 0.0–0.7)
Eosinophil %: 6.6 %
HCT: 32.8 % — ABNORMAL LOW (ref 40.0–52.0)
HGB: 10.6 g/dL — ABNORMAL LOW (ref 13.0–18.0)
Lymphocyte #: 0.6 10*3/uL — ABNORMAL LOW (ref 1.0–3.6)
Lymphocyte %: 11.7 %
MCH: 31.3 pg (ref 26.0–34.0)
MCHC: 32.3 g/dL (ref 32.0–36.0)
MCV: 97 fL (ref 80–100)
Monocyte #: 0.5 x10 3/mm (ref 0.2–1.0)
Monocyte %: 9.9 %
Neutrophil #: 3.6 10*3/uL (ref 1.4–6.5)
Neutrophil %: 70.4 %
Platelet: 162 10*3/uL (ref 150–440)
RBC: 3.39 10*6/uL — ABNORMAL LOW (ref 4.40–5.90)
RDW: 18.1 % — ABNORMAL HIGH (ref 11.5–14.5)
WBC: 5.1 10*3/uL (ref 3.8–10.6)

## 2012-05-17 LAB — MAGNESIUM: Magnesium: 2.2 mg/dL

## 2012-05-17 LAB — BASIC METABOLIC PANEL
Chloride: 105 mmol/L (ref 98–107)
Creatinine: 11.72 mg/dL — ABNORMAL HIGH (ref 0.60–1.30)
EGFR (African American): 5 — ABNORMAL LOW
EGFR (Non-African Amer.): 4 — ABNORMAL LOW
Osmolality: 298 (ref 275–301)
Potassium: 6.6 mmol/L (ref 3.5–5.1)
Sodium: 141 mmol/L (ref 136–145)

## 2012-05-17 LAB — LACTATE DEHYDROGENASE: LDH: 191 U/L (ref 85–241)

## 2012-05-19 LAB — CBC WITH DIFFERENTIAL/PLATELET
Basophil %: 0.9 %
Eosinophil #: 0.3 10*3/uL (ref 0.0–0.7)
HCT: 34.7 % — ABNORMAL LOW (ref 40.0–52.0)
HGB: 11.3 g/dL — ABNORMAL LOW (ref 13.0–18.0)
Lymphocyte #: 0.5 10*3/uL — ABNORMAL LOW (ref 1.0–3.6)
Lymphocyte %: 10.6 %
MCHC: 32.6 g/dL (ref 32.0–36.0)
MCV: 96 fL (ref 80–100)
Monocyte #: 0.6 x10 3/mm (ref 0.2–1.0)
Monocyte %: 13.1 %
Neutrophil #: 3.1 10*3/uL (ref 1.4–6.5)
Platelet: 153 10*3/uL (ref 150–440)
WBC: 4.6 10*3/uL (ref 3.8–10.6)

## 2012-05-19 LAB — PHOSPHORUS: Phosphorus: 10.6 mg/dL — ABNORMAL HIGH (ref 2.5–4.9)

## 2012-05-19 LAB — RENAL FUNCTION PANEL
Anion Gap: 12 (ref 7–16)
EGFR (African American): 6 — ABNORMAL LOW
EGFR (Non-African Amer.): 6 — ABNORMAL LOW
Glucose: 81 mg/dL (ref 65–99)
Phosphorus: 8.9 mg/dL — ABNORMAL HIGH (ref 2.5–4.9)
Sodium: 137 mmol/L (ref 136–145)

## 2012-05-20 ENCOUNTER — Other Ambulatory Visit: Payer: Self-pay

## 2012-05-20 LAB — TROPONIN I: Troponin-I: 0.05 ng/mL

## 2012-05-21 LAB — TROPONIN I: Troponin-I: 0.05 ng/mL

## 2012-05-22 LAB — CBC WITH DIFFERENTIAL/PLATELET
Basophil #: 0.1 10*3/uL (ref 0.0–0.1)
Eosinophil #: 0.3 10*3/uL (ref 0.0–0.7)
HCT: 31.8 % — ABNORMAL LOW (ref 40.0–52.0)
Lymphocyte #: 0.3 10*3/uL — ABNORMAL LOW (ref 1.0–3.6)
MCH: 31.3 pg (ref 26.0–34.0)
MCHC: 32.7 g/dL (ref 32.0–36.0)
MCV: 96 fL (ref 80–100)
Monocyte #: 0.7 x10 3/mm (ref 0.2–1.0)
Monocyte %: 16.2 %
Neutrophil #: 2.8 10*3/uL (ref 1.4–6.5)
Neutrophil %: 66.2 %
Platelet: 115 10*3/uL — ABNORMAL LOW (ref 150–440)
WBC: 4.2 10*3/uL (ref 3.8–10.6)

## 2012-05-23 LAB — CBC WITH DIFFERENTIAL/PLATELET
Basophil #: 0 10*3/uL (ref 0.0–0.1)
Eosinophil %: 1.1 %
HCT: 30.5 % — ABNORMAL LOW (ref 40.0–52.0)
HGB: 9.8 g/dL — ABNORMAL LOW (ref 13.0–18.0)
Lymphocyte #: 0.1 10*3/uL — ABNORMAL LOW (ref 1.0–3.6)
Monocyte #: 0.5 x10 3/mm (ref 0.2–1.0)
Neutrophil #: 5.1 10*3/uL (ref 1.4–6.5)
Neutrophil %: 87.3 %
Platelet: 112 10*3/uL — ABNORMAL LOW (ref 150–440)
RBC: 3.2 10*6/uL — ABNORMAL LOW (ref 4.40–5.90)
WBC: 5.9 10*3/uL (ref 3.8–10.6)

## 2012-05-25 LAB — BASIC METABOLIC PANEL
Anion Gap: 6 — ABNORMAL LOW (ref 7–16)
BUN: 15 mg/dL (ref 7–18)
Calcium, Total: 9.7 mg/dL (ref 8.5–10.1)
Chloride: 99 mmol/L (ref 98–107)
Creatinine: 5.84 mg/dL — ABNORMAL HIGH (ref 0.60–1.30)
EGFR (African American): 11 — ABNORMAL LOW
Potassium: 3.8 mmol/L (ref 3.5–5.1)
Sodium: 137 mmol/L (ref 136–145)

## 2012-05-25 LAB — RENAL FUNCTION PANEL
BUN: 27 mg/dL — ABNORMAL HIGH (ref 7–18)
Calcium, Total: 9.6 mg/dL (ref 8.5–10.1)
Co2: 31 mmol/L (ref 21–32)
EGFR (African American): 7 — ABNORMAL LOW
Glucose: 105 mg/dL — ABNORMAL HIGH (ref 65–99)
Osmolality: 277 (ref 275–301)
Sodium: 136 mmol/L (ref 136–145)

## 2012-05-25 LAB — PHOSPHORUS: Phosphorus: 6.3 mg/dL — ABNORMAL HIGH (ref 2.5–4.9)

## 2012-05-26 LAB — CBC WITH DIFFERENTIAL/PLATELET
Basophil #: 0 10*3/uL (ref 0.0–0.1)
Eosinophil #: 0 10*3/uL (ref 0.0–0.7)
Eosinophil %: 1.3 %
HGB: 9.7 g/dL — ABNORMAL LOW (ref 13.0–18.0)
Lymphocyte #: 0.2 10*3/uL — ABNORMAL LOW (ref 1.0–3.6)
MCV: 97 fL (ref 80–100)
Monocyte #: 0.2 x10 3/mm (ref 0.2–1.0)
Monocyte %: 5.3 %
Neutrophil #: 2.7 10*3/uL (ref 1.4–6.5)
Neutrophil %: 87.8 %
Platelet: 151 10*3/uL (ref 150–440)
RBC: 3.06 10*6/uL — ABNORMAL LOW (ref 4.40–5.90)
RDW: 16.3 % — ABNORMAL HIGH (ref 11.5–14.5)

## 2012-05-26 LAB — PHOSPHORUS: Phosphorus: 5.9 mg/dL — ABNORMAL HIGH (ref 2.5–4.9)

## 2012-05-27 LAB — PHOSPHORUS: Phosphorus: 6.2 mg/dL — ABNORMAL HIGH (ref 2.5–4.9)

## 2012-05-27 LAB — PLATELET COUNT: Platelet: 175 10*3/uL (ref 150–440)

## 2012-05-30 LAB — COMPREHENSIVE METABOLIC PANEL
Albumin: 2.7 g/dL — ABNORMAL LOW (ref 3.4–5.0)
Alkaline Phosphatase: 85 U/L (ref 50–136)
Anion Gap: 9 (ref 7–16)
BUN: 81 mg/dL — ABNORMAL HIGH (ref 7–18)
Chloride: 103 mmol/L (ref 98–107)
Co2: 26 mmol/L (ref 21–32)
EGFR (Non-African Amer.): 6 — ABNORMAL LOW
Glucose: 112 mg/dL — ABNORMAL HIGH (ref 65–99)
Osmolality: 301 (ref 275–301)
SGOT(AST): 14 U/L — ABNORMAL LOW (ref 15–37)
SGPT (ALT): 8 U/L — ABNORMAL LOW (ref 12–78)
Sodium: 138 mmol/L (ref 136–145)
Total Protein: 6 g/dL — ABNORMAL LOW (ref 6.4–8.2)

## 2012-05-30 LAB — HEMOGLOBIN: HGB: 10 g/dL — ABNORMAL LOW (ref 13.0–18.0)

## 2012-06-09 ENCOUNTER — Ambulatory Visit: Payer: Self-pay | Admitting: Internal Medicine

## 2012-07-09 LAB — CBC
HCT: 36.2 % — ABNORMAL LOW (ref 40.0–52.0)
HGB: 12 g/dL — ABNORMAL LOW (ref 13.0–18.0)
MCH: 32.1 pg (ref 26.0–34.0)
MCHC: 33.1 g/dL (ref 32.0–36.0)
Platelet: 163 10*3/uL (ref 150–440)
RBC: 3.74 10*6/uL — ABNORMAL LOW (ref 4.40–5.90)
WBC: 4.3 10*3/uL (ref 3.8–10.6)

## 2012-07-10 ENCOUNTER — Ambulatory Visit: Payer: Self-pay | Admitting: Internal Medicine

## 2012-07-10 DIAGNOSIS — I359 Nonrheumatic aortic valve disorder, unspecified: Secondary | ICD-10-CM

## 2012-07-10 LAB — BASIC METABOLIC PANEL
BUN: 39 mg/dL — ABNORMAL HIGH (ref 7–18)
Calcium, Total: 10.9 mg/dL — ABNORMAL HIGH (ref 8.5–10.1)
Chloride: 104 mmol/L (ref 98–107)
Co2: 27 mmol/L (ref 21–32)
EGFR (African American): 8 — ABNORMAL LOW
Glucose: 93 mg/dL (ref 65–99)
Potassium: 4.5 mmol/L (ref 3.5–5.1)
Sodium: 141 mmol/L (ref 136–145)

## 2012-07-10 LAB — CK TOTAL AND CKMB (NOT AT ARMC)
CK, Total: 57 U/L (ref 35–232)
CK, Total: 57 U/L (ref 35–232)
CK, Total: 97 U/L (ref 35–232)
CK-MB: 4 ng/mL — ABNORMAL HIGH (ref 0.5–3.6)

## 2012-07-10 LAB — TROPONIN I
Troponin-I: 0.02 ng/mL
Troponin-I: 0.04 ng/mL

## 2012-07-10 LAB — PROTIME-INR: Prothrombin Time: 13.7 secs (ref 11.5–14.7)

## 2012-07-10 LAB — APTT: Activated PTT: >160 secs (ref 23.6–35.9)

## 2012-07-11 LAB — CBC WITH DIFFERENTIAL/PLATELET
Basophil %: 1.1 %
Eosinophil #: 0.2 10*3/uL (ref 0.0–0.7)
Eosinophil %: 6.6 %
Lymphocyte #: 0.5 10*3/uL — ABNORMAL LOW (ref 1.0–3.6)
Lymphocyte %: 17.3 %
MCH: 31.8 pg (ref 26.0–34.0)
MCV: 98 fL (ref 80–100)
Monocyte %: 15.6 %
Neutrophil #: 1.9 10*3/uL (ref 1.4–6.5)
Neutrophil %: 59.4 %
RBC: 3.34 10*6/uL — ABNORMAL LOW (ref 4.40–5.90)
RDW: 18.2 % — ABNORMAL HIGH (ref 11.5–14.5)
WBC: 3.1 10*3/uL — ABNORMAL LOW (ref 3.8–10.6)

## 2012-07-11 LAB — BASIC METABOLIC PANEL
Anion Gap: 12 (ref 7–16)
Calcium, Total: 9.2 mg/dL (ref 8.5–10.1)
Chloride: 103 mmol/L (ref 98–107)
Co2: 25 mmol/L (ref 21–32)
Creatinine: 9.28 mg/dL — ABNORMAL HIGH (ref 0.60–1.30)
EGFR (African American): 6 — ABNORMAL LOW
EGFR (Non-African Amer.): 5 — ABNORMAL LOW
Glucose: 70 mg/dL (ref 65–99)

## 2012-07-12 LAB — BASIC METABOLIC PANEL
Anion Gap: 7 (ref 7–16)
Calcium, Total: 9.8 mg/dL (ref 8.5–10.1)
EGFR (Non-African Amer.): 8 — ABNORMAL LOW

## 2012-07-12 LAB — CK TOTAL AND CKMB (NOT AT ARMC): CK-MB: 6.5 ng/mL — ABNORMAL HIGH (ref 0.5–3.6)

## 2012-07-13 ENCOUNTER — Inpatient Hospital Stay: Payer: Self-pay | Admitting: Internal Medicine

## 2012-08-09 ENCOUNTER — Ambulatory Visit: Payer: Self-pay | Admitting: Internal Medicine

## 2012-11-23 ENCOUNTER — Emergency Department: Payer: Self-pay | Admitting: Emergency Medicine

## 2012-11-23 LAB — PRO B NATRIURETIC PEPTIDE: B-Type Natriuretic Peptide: 102036 pg/mL — ABNORMAL HIGH (ref 0–125)

## 2012-11-23 LAB — TROPONIN I: Troponin-I: 0.38 ng/mL — ABNORMAL HIGH

## 2012-11-23 LAB — CBC
HCT: 29 % — ABNORMAL LOW (ref 40.0–52.0)
MCH: 31.6 pg (ref 26.0–34.0)
MCHC: 33.4 g/dL (ref 32.0–36.0)
Platelet: 152 10*3/uL (ref 150–440)
WBC: 4.2 10*3/uL (ref 3.8–10.6)

## 2012-11-23 LAB — COMPREHENSIVE METABOLIC PANEL WITH GFR
Albumin: 2.8 g/dL — ABNORMAL LOW
Alkaline Phosphatase: 115 U/L
Anion Gap: 9
BUN: 33 mg/dL — ABNORMAL HIGH
Bilirubin,Total: 0.5 mg/dL
Calcium, Total: 10.2 mg/dL — ABNORMAL HIGH
Chloride: 100 mmol/L
Co2: 28 mmol/L
Creatinine: 6.32 mg/dL — ABNORMAL HIGH
EGFR (African American): 10 — ABNORMAL LOW
EGFR (Non-African Amer.): 9 — ABNORMAL LOW
Glucose: 88 mg/dL
Osmolality: 280
Potassium: 3.9 mmol/L
SGOT(AST): 17 U/L
SGPT (ALT): 9 U/L — ABNORMAL LOW
Sodium: 137 mmol/L
Total Protein: 6.3 g/dL — ABNORMAL LOW

## 2012-11-23 LAB — CK TOTAL AND CKMB (NOT AT ARMC)
CK, Total: 64 U/L
CK-MB: 4.7 ng/mL — ABNORMAL HIGH

## 2012-12-07 ENCOUNTER — Other Ambulatory Visit: Payer: Self-pay | Admitting: Family Medicine

## 2012-12-07 LAB — PROTIME-INR: Prothrombin Time: 13.4 secs (ref 11.5–14.7)

## 2013-03-12 DEATH — deceased

## 2013-04-27 IMAGING — CR DG CHEST 2V
1 series · 4 of 4 positions shown · non-contrast
Comparison: none

REASON FOR EXAM: SOB
COMMENTS:

[Series 3: x chest ap · 0.14mm/px · 4 of 4 slices shown]
[im 1/4]
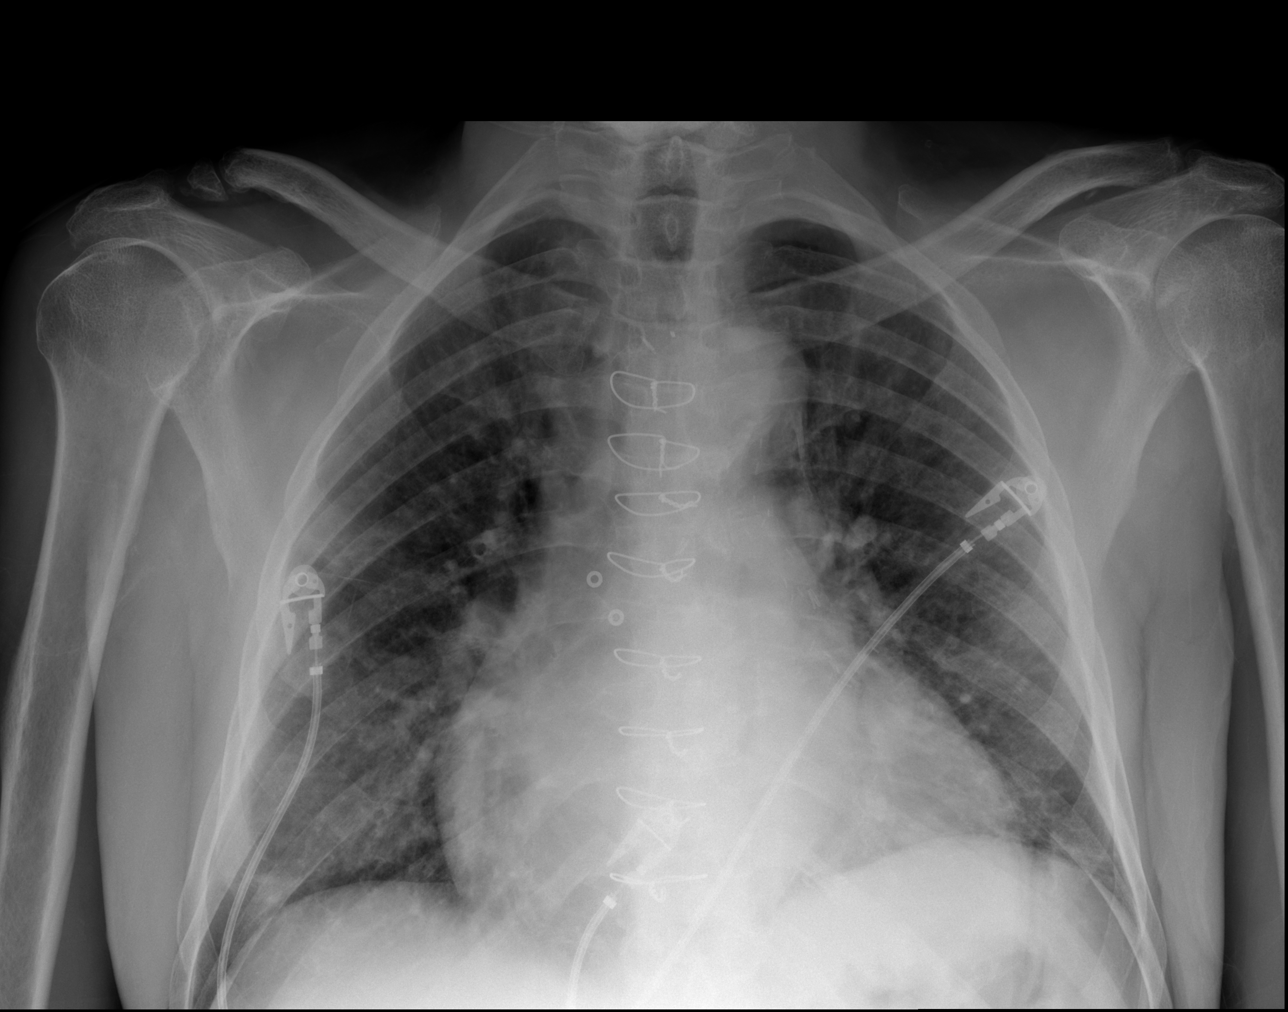
[im 2/4]
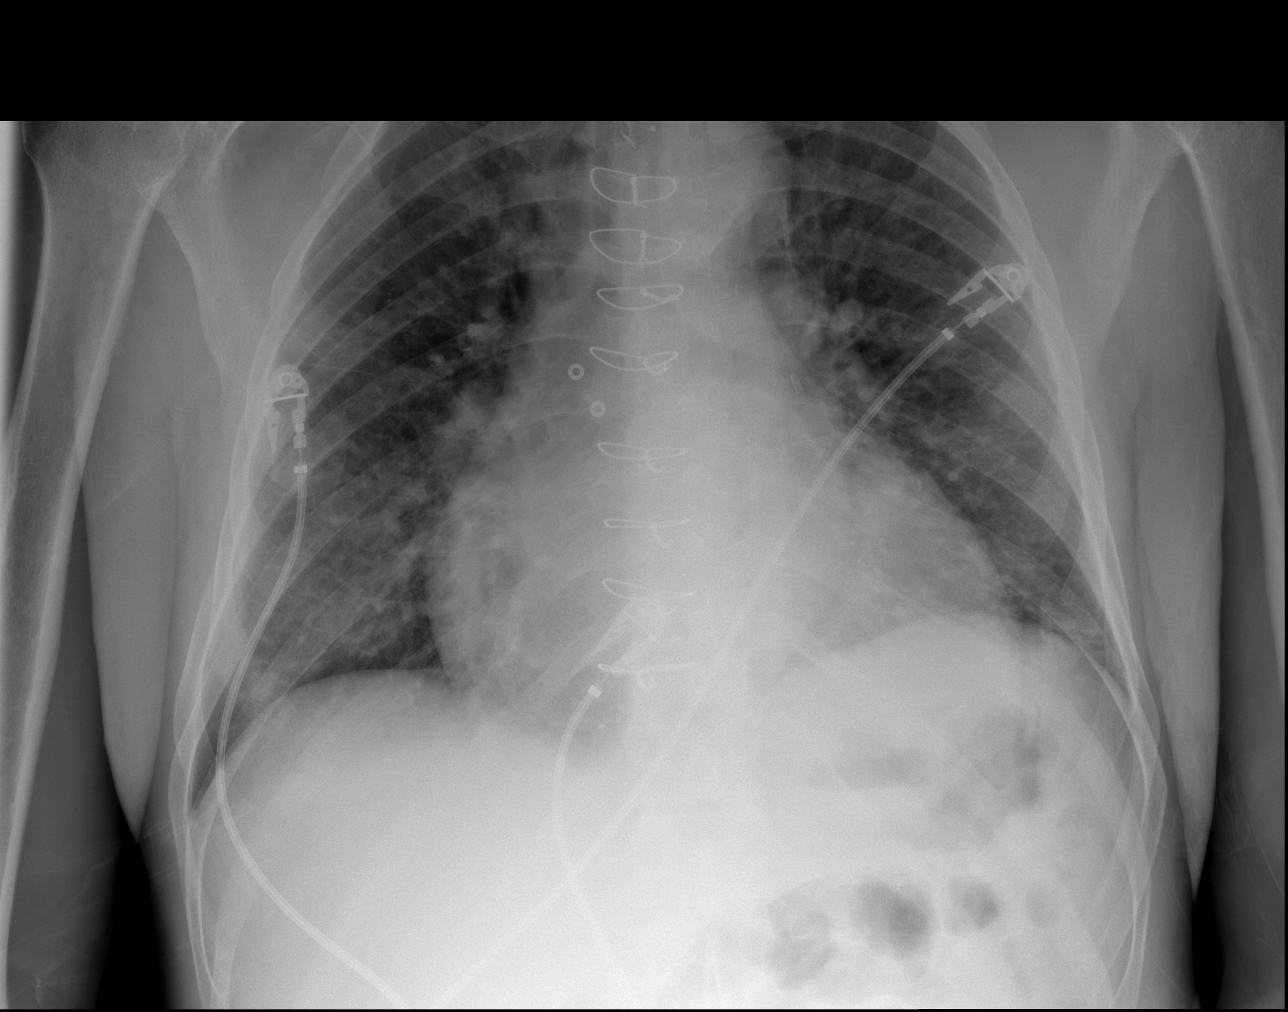
[im 3/4]
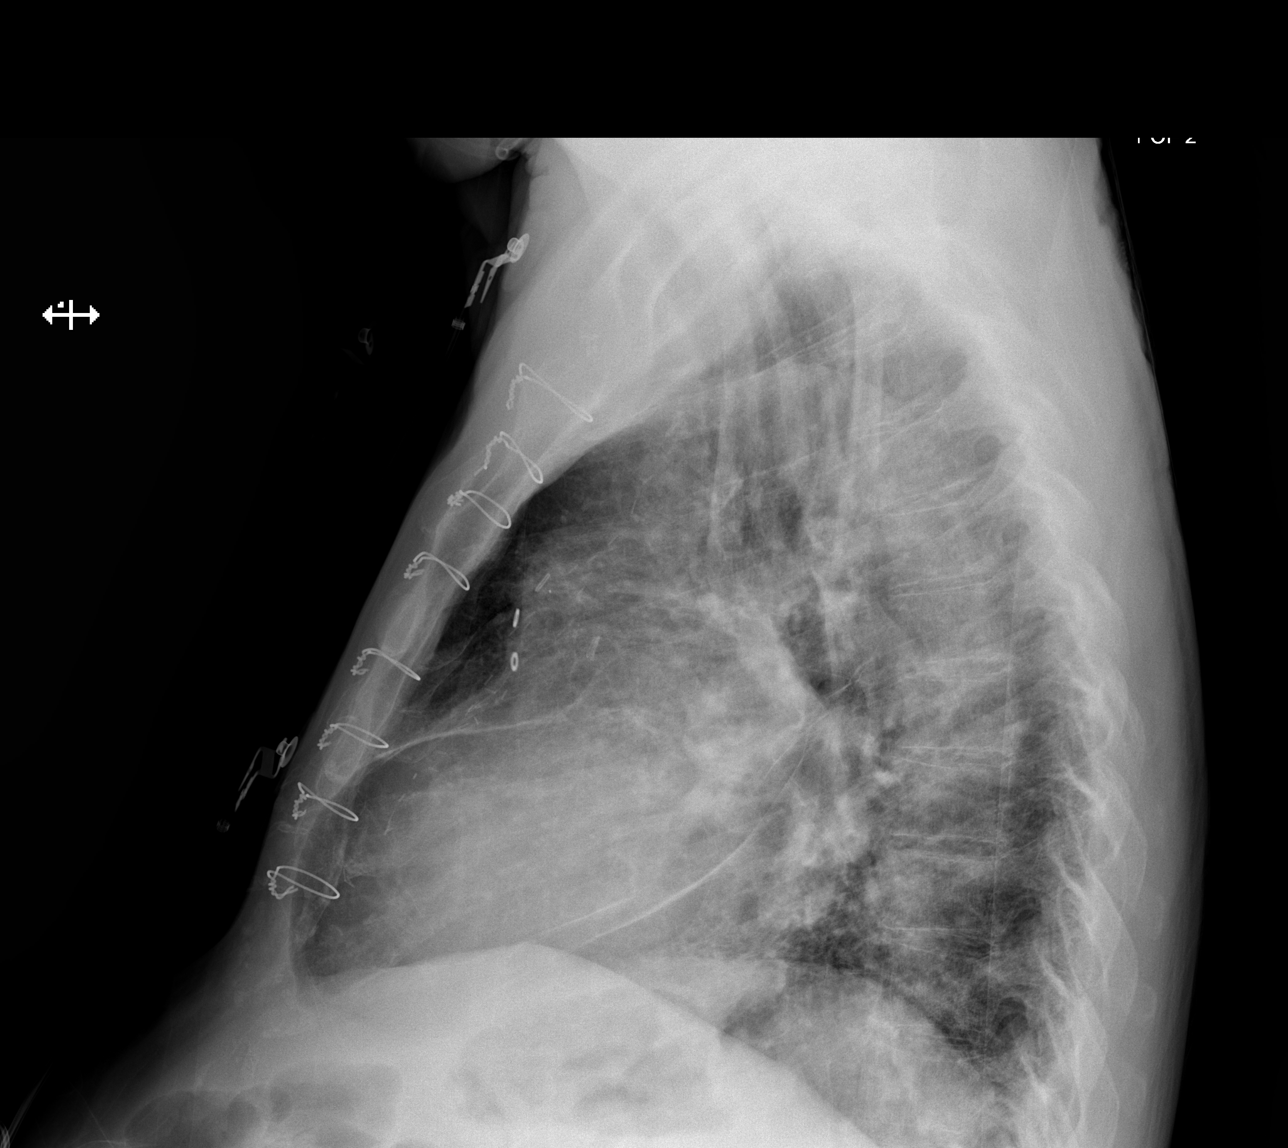
[im 4/4]
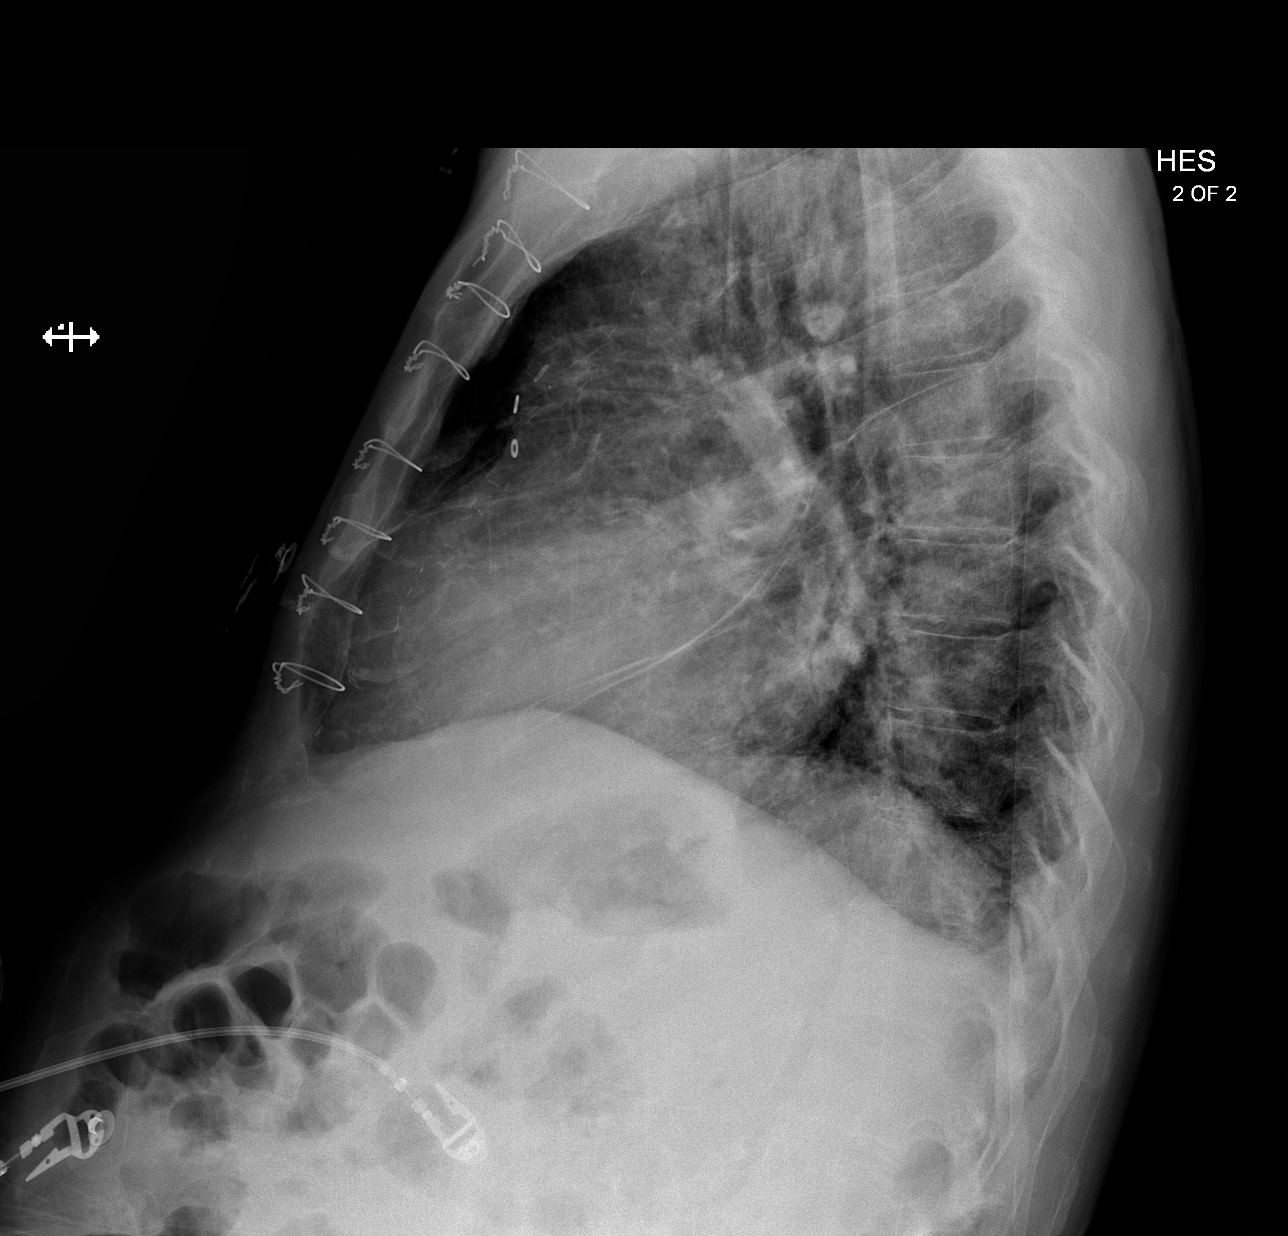

[4 of 4 positions shown; findings below may reference images not displayed]

PROCEDURE:     DXR - DXR CHEST PA (OR AP) AND LATERAL  - November 14, 2011  [DATE]

RESULT:     Cardiomegaly and pulmonary vascular prominence with bilateral
interstitial and alveolar prominence. These findings are consistent with
congestive heart failure. These findings have increased slightly from
10/30/2011.
IMPRESSION: Congestive heart failure with pulmonary edema.

## 2013-05-10 IMAGING — CT CT ABD-PELV W/ CM
1 of 2 series · 14 of 32 positions shown, 18 images · IV contrast (isovue)
Comparison: none

REASON FOR EXAM: (1) SEVERE ABD PAIN; (2) SEVERE ABD PAIN
COMMENTS:

PROCEDURE:     CT  - CT ABDOMEN / PELVIS  W  - November 27, 2011  [DATE]
RESULT:     Comparison:  10/29/2011
TECHNIQUE: Multiple axial images of the abdomen and pelvis were performed
from the lung bases to the pubic symphysis, without p.o. contrast and with
85 mL of Isovue 300 intravenous contrast.

[Series 2: 3mm soft tissue · axial · 0.85mm/px · z∈[-900,-504]mm · 14 of 150 slices shown, 18 images]
[im 12/150  soft-tissue]
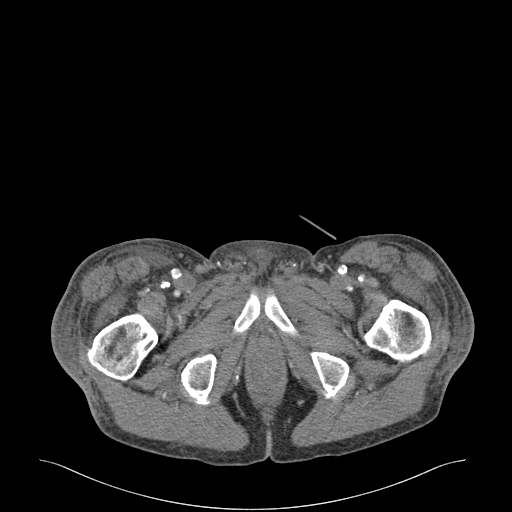
[im 12/150  bone]
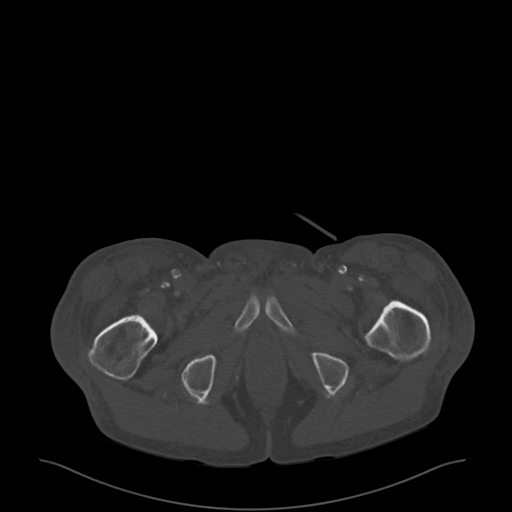
[im 24/150  soft-tissue]
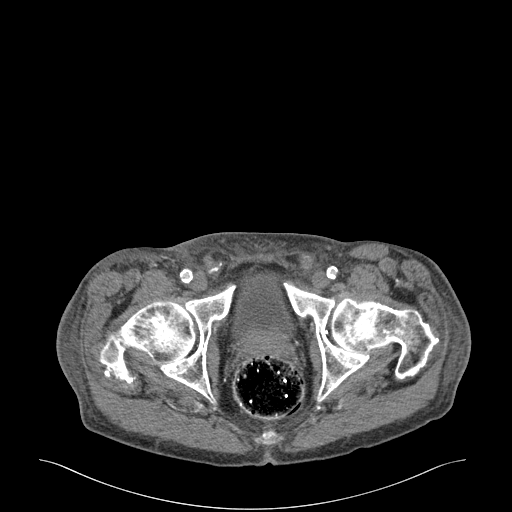
[im 36/150  soft-tissue]
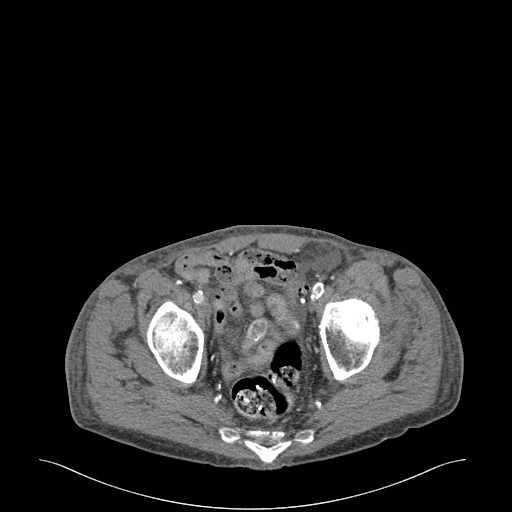
[im 48/150  soft-tissue]
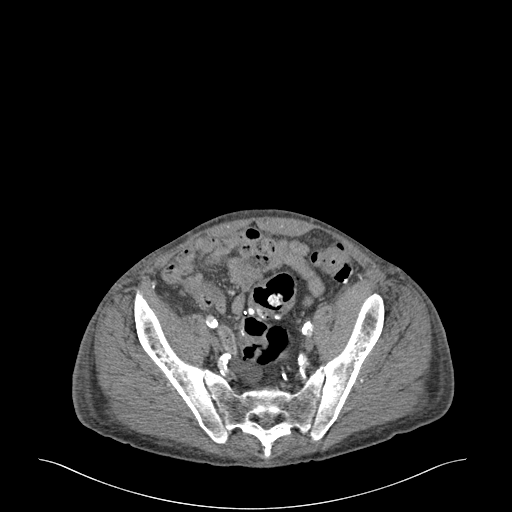
[im 60/150  soft-tissue]
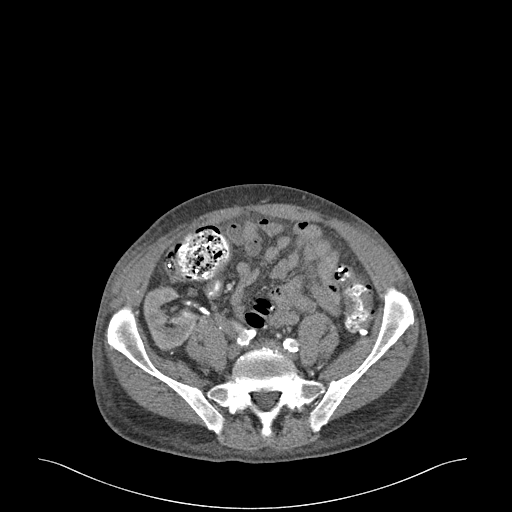
[im 72/150  soft-tissue]
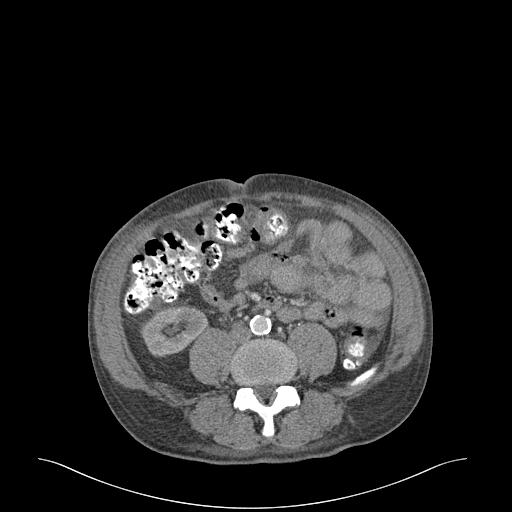
[im 84/150  soft-tissue]
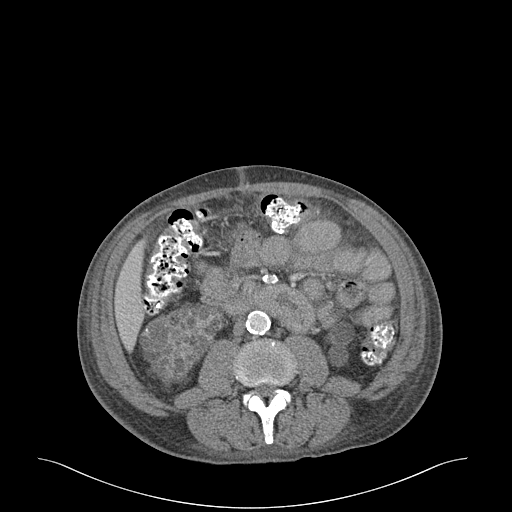
[im 96/150  soft-tissue]
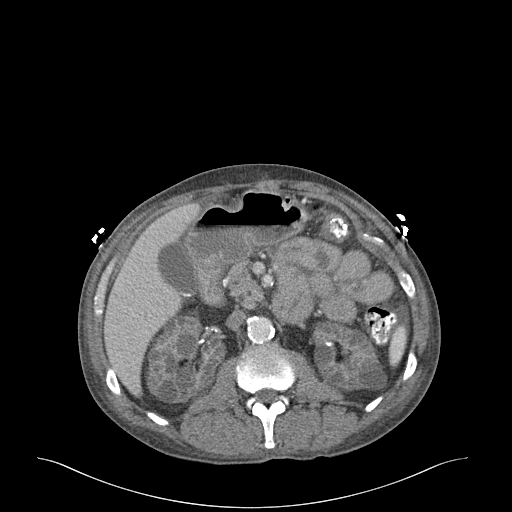
[im 108/150  soft-tissue]
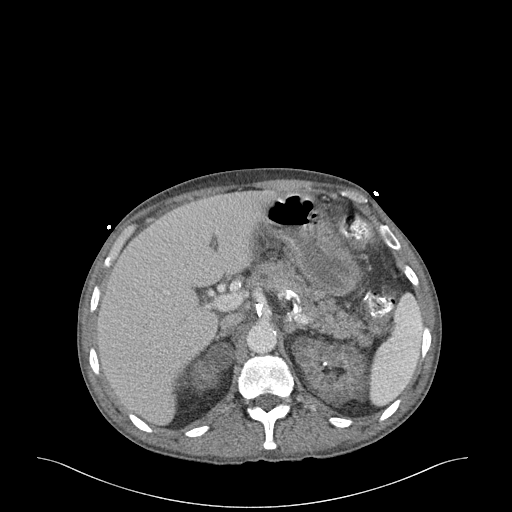
[im 108/150  bone]
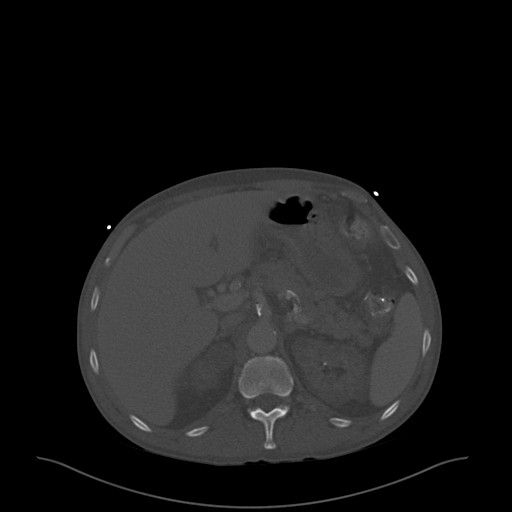
[im 120/150  soft-tissue]
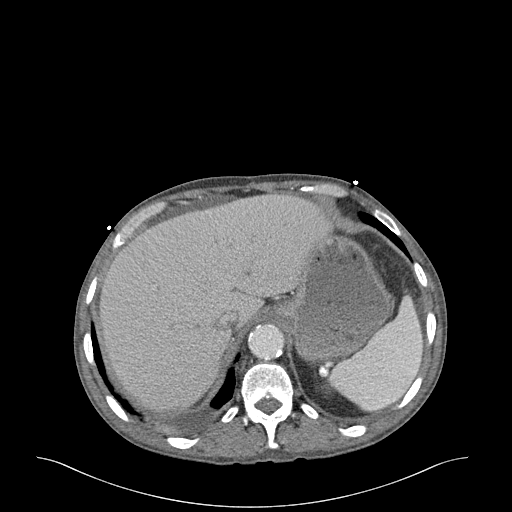
[im 126/150  lung]
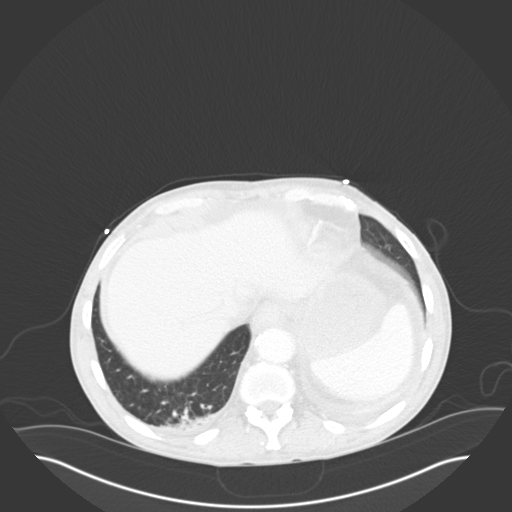
[im 132/150  soft-tissue]
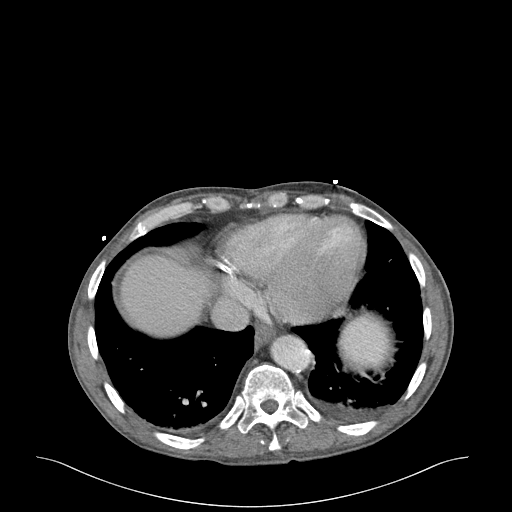
[im 132/150  lung]
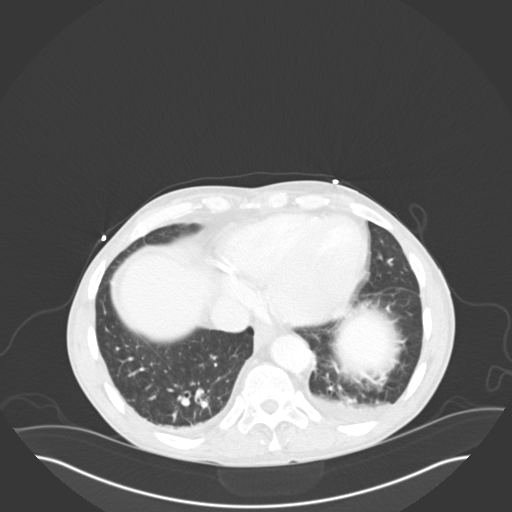
[im 138/150  lung]
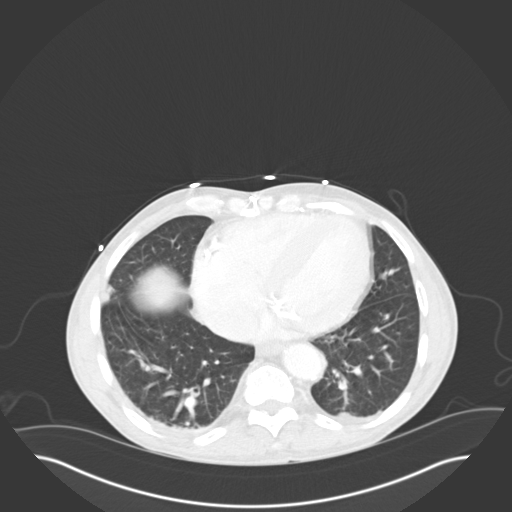
[im 144/150  soft-tissue]
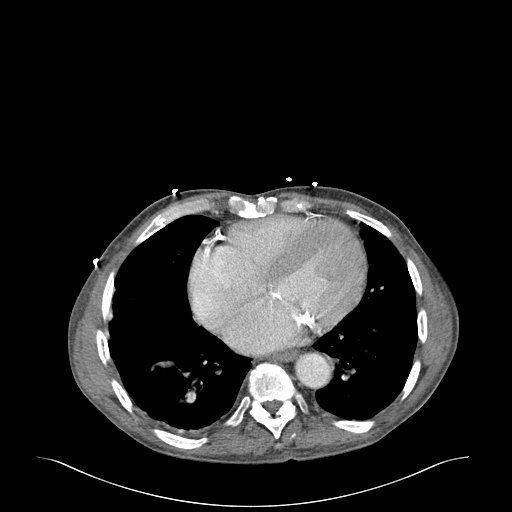
[im 144/150  lung]
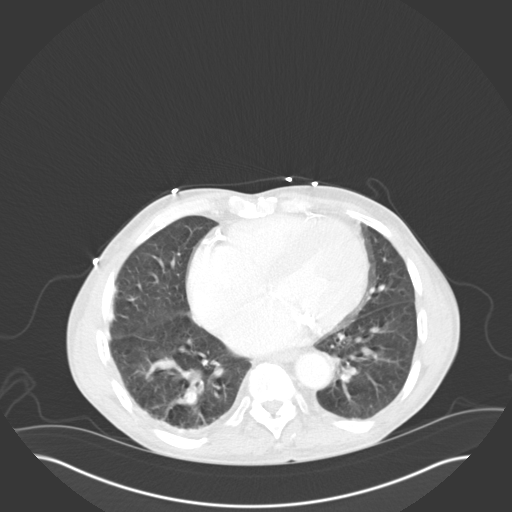

[14 of 32 positions shown; findings below may reference images not displayed]

FINDINGS: Minimal opacities at the periphery of the right middle lobe, right lower
lobe, and left lower lobe are likely secondary to atelectasis. There is a
trace left pleural effusion.

There is mild periportal edema in the liver, which is nonspecific. Small
calcified gallstones are seen within the gallbladder. Subcentimeter
low-attenuation focus in the spleen is too small to characterize. The
adrenals are unremarkable. There is mild dilatation of the main pancreatic
duct in the head and body of the pancreas. This is nonspecific. This is
similar to prior. There are innumerable low-attenuation lesions within the
kidneys. The larger lesions are consistent with cysts. The others are too
small to characterize. Calcifications within the renal hila are likely
vascular. There is diffuse anasarca. There is a mild amount of free fluid in
the abdomen and pelvis. There is diverticulosis of the sigmoid colon. There
is mild bowel wall thickening of the sigmoid colon as seen on the prior
examination. There are a few loops of small bowel which demonstrate mild
bowel wall thickening. This is nonspecific. The appendix is and not
definitely visualized. However, there are no inflammatory changes at the
base of the cecum. There is no abdominal aortic aneurysm. High density
material within the colon may related to ingested material. There are
extensive vascular calcifications. There is a transplant kidney in the right
hemipelvis. Mild thickening of the distal esophagus is likely secondary to
underdistention. If there is continued clinical concern, further evaluation
could be provided with endoscopy.

No aggressive lytic or sclerotic osseous lesions are identified.
IMPRESSION: 1. There are a few loops of small bowel which demonstrate mild bowel wall
thickening. This may be related to underdistention. However, a nonspecific
enteritis is not excluded.
2. There is mild bowel wall thickening of the sigmoid colon. This may
related to changes of chronic diverticulosis and underdistention. However,
direct visualization is recommended if not recently performed, as malignancy
can have a similar appearance.
3. There is mild dilatation of the main pancreatic duct in the region of the
pancreatic head and body. This is nonspecific, but similar to recent prior.
Clinical correlation is recommended. Further elevation could be provided
with ERCP, as indicated.
4. Anasarca with trace amount of free fluid in the abdomen and pelvis.

## 2013-09-15 IMAGING — CR DG CHEST 1V PORT
1 series · 1 of 1 positions shown · non-contrast
Comparison: none

REASON FOR EXAM: RLL pneumonia. worsening hypoxia
COMMENTS:

[ap]
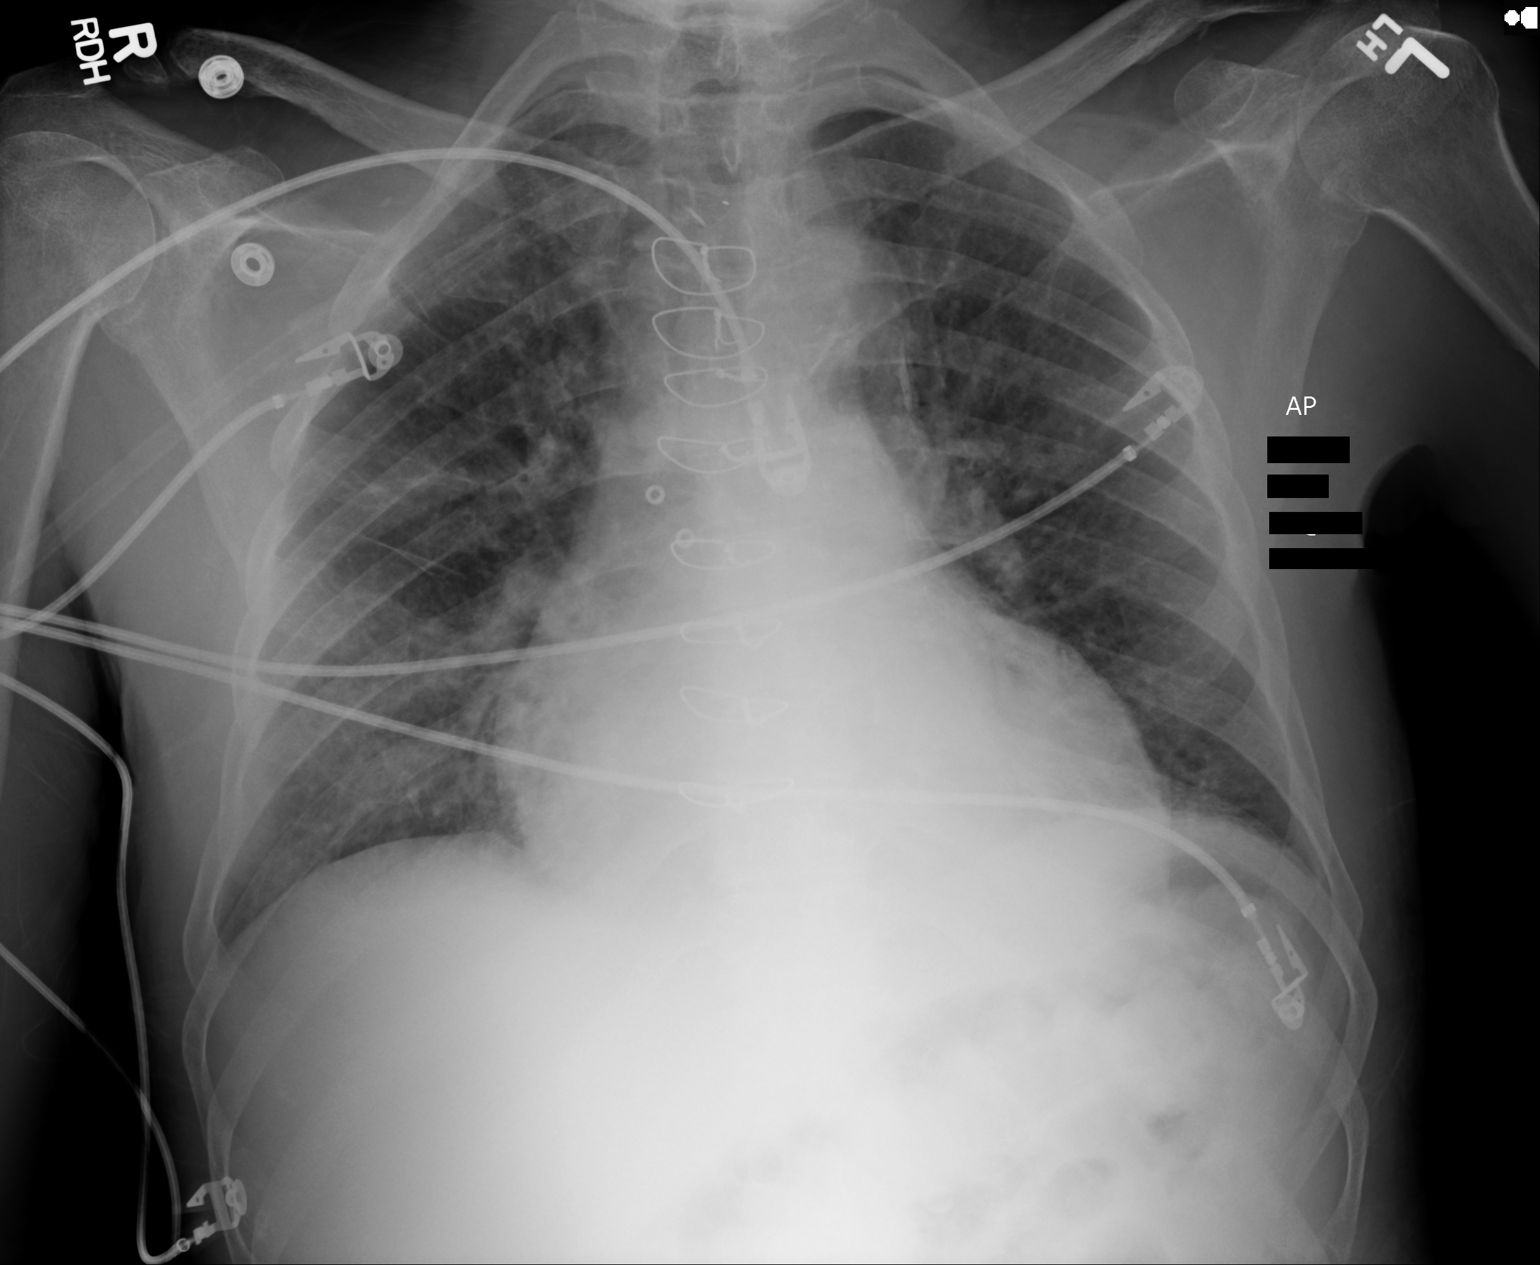

[1 of 1 positions shown; findings below may reference images not displayed]

PROCEDURE:     DXR - DXR PORTABLE CHEST SINGLE VIEW  - April 03, 2012  [DATE]

RESULT:     Comparison is made to the study April 02, 2012.

The lungs are reasonably well inflated. The cardiac silhouette remains
mildly enlarged. The pulmonary interstitial markings are slightly more
conspicuous today especially on the right. There is no pleural effusion.
IMPRESSION: Some worsening of the interstitial markings especially on
the right is present which may reflect progressive interstitial edema and
alveolar filling. A followup PA and lateral chest x-ray would be of value
when the patient can tolerate the procedure.

[REDACTED]

## 2014-01-18 ENCOUNTER — Encounter (HOSPITAL_COMMUNITY): Payer: Self-pay | Admitting: Cardiovascular Disease

## 2014-05-29 NOTE — Consult Note (Signed)
Brief Consult Note: Diagnosis: AF, borderline elevated troponin, probable demand/ supply ischemia.   Patient was seen by consultant.   Consult note dictated.   Comments: REC  Agree with current therapy, cont amio drip for now, likeyl convert to po in am, defer cardiac cath at this time unless patient continues to have CP despite adequate rate control.  Electronic Signatures: Marcina MillardParaschos, Averiana Clouatre (MD)  (Signed 02-Dec-13 16:44)  Authored: Brief Consult Note   Last Updated: 02-Dec-13 16:44 by Marcina MillardParaschos, Zeynab Klett (MD)

## 2014-05-29 NOTE — Discharge Summary (Signed)
PATIENT NAME:  Russell [name], Russell [name] MR#:  [id number] DATE OF BIRTH:  Sep 12, 1950  DATE OF ADMISSION:  12/28/2011 DATE OF DISCHARGE:  01/03/2012  CHIEF COMPLAINT: Nausea, abdominal pain, diaphoresis.   DISCHARGE DIAGNOSES:  1. Atrial fibrillation with RVR. 2. Non-ST elevation myocardial infarction. 3. End-stage renal disease on dialysis. 4. Chronic diastolic congestive heart failure. 5. Abdominal pain.  6. Chronic obstructive pulmonary disease.  7. Hyperlipidemia, intolerant to statins.  8. Hypertension.  9. History of recent acute pancreatitis.  10. History of recent hospitalization again for chronic obstructive pulmonary disease exacerbation, pulmonary edema, and diastolic congestive heart failure exacerbation.  11. Anemia of chronic disease.  12. Coronary artery disease, status post CABG and stent to [facility]CA [facility].  13. Tobacco abuse.   DISCHARGE MEDICATIONS:  1. Metoprolol 25 mg 2 times a day.  2. Claritin 10 mg daily.  3. Nephro-Vite oral tab 1 tab daily.  4. Aspirin 81 mg daily. 5. Docusate sodium 100 mg once a day.  6. Senna Plus 50/8.6 oral tab 2 tabs once a day. 7. Plavix 75 mg daily.  8. Amlodipine 10 mg daily.  9. Losartan 100 mg daily.  10. Isosorbide mononitrate 30 mg extended-release 1 tab daily.  11. Acetaminophen 650 mg every four hours as needed for pain or temperature greater than 100.4. 12. Diltiazem 120 mg one cap daily 24-hour release. 13. Advair 250/50 mcg one puff two times a day.  14. Theophylline 100 mg extended-release 1 tab daily.  15. Nitroglycerin 0.4 mg sublingual p.r.n. every five minutes as needed for chest pain.  16. Percocet 325/5 mg one tab every six hours as needed for pain.   DIET: Low sodium, renal diet.   ACTIVITY: As tolerated.   DISCHARGE FOLLOW-UP: Please follow-up with your primary care physician in 1 to 2 days and with your dialysis center tomorrow.   DISPOSITION: Home.   HISTORY OF PRESENT ILLNESS: For full  details of the history and physical, please see the dictation on November 18th by Dr. Dava NajjarPanwar. Briefly, this is a 64 year old male with history of multiple medical issues including ongoing tobacco use, COPD, chronic diastolic CHF, end-stage renal disease on dialysis, recent PCI to RCA, history of CABG, and recent hospitalizations for pancreatitis and respiratory failure who presented with nausea, abdominal pain, and diaphoresis and was noted to have atrial fibrillation with RVR and admitted to the hospitalist service. The patient initially was started on Cardizem drip. He is not on anticoagulation for his atrial fibrillation and has paroxysmal atrial fibrillation. He is not on Coumadin because of history of anemia requiring blood transfusions in the past. He was also relatively hypotensive on arrival and had 500 mL normal saline bolus. His medications for blood pressure were held as well.   SIGNIFICANT LABS AND IMAGING: Initial creatinine 3.16, BUN 12, potassium 3, amylase on arrival 116, lipase 150. LFTs on arrival within normal limits. Initial troponin 0.1, then 2.4, then 3.5. Initial CK-MB 8.4, then 13.3, then 10.1. Initial WBC 4.2, hemoglobin 9, platelets 143. Hep B core antibody was negative. Parathyroid hormone was 267. Lactic acid on November 19th was 0.7.  CT of abdomen and pelvis without contrast no acute abnormalities. This was on November 18th. 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 No biliary distention. Pancreas appears normal. Sigmoid diverticulosis.   CT abdomen and pelvis with contrast done again on November 20th showing no evidence of mesenteric ischemia.   CT chest without contrast on November 22nd showing  interstitial findings, pulmonary edema, possible underlying pulmonary fibrosis is also a diagnostic consideration. Small bilateral effusions. Atelectasis versus infiltrate in lung bases.  Ultrasound of the abdomen, general survey, on  November 19th showing cholelithiasis, trace amount of pericholecystic fluid. No gallbladder wall thickening. Polycystic kidney disease.   MRI of lumbar spine done on November 23rd without contrast showing polycystic kidney disease bilaterally. No other significant abnormalities demonstrated. X-ray rib series bilateral on November 23rd showing no acute bony abnormalities appreciated. No bony destruction, fracture, dislocation, costochondral calcification is present.   HOSPITAL COURSE: The patient was admitted to the hospitalist service with atrial fibrillation with RVR controlled with Cardizem. The patient was seen by Cardiology. Currently he is on Cardizem and metoprolol with good rate control. The patient did have bouts of hypotension on arrival and responded to IV fluids and initially some blood pressure medications were held. At this point, blood pressure medications have been resumed and can be discharged with the medications described above. The patient did have elevated troponin and was seen by Cardiology. This was deemed to be non-ST elevation MI. The patient of note has had recent stent. Per cardiology, medical management was recommended. At this point he is on aspirin, beta-blocker, Imdur, and nitroglycerin. He is also on Plavix. He should follow-up with his primary cardiologist as an outpatient. The patient did have bouts of abdominal pain and he was thoroughly investigated with abdominal ultrasound, CAT scans x2, and was seen by GI and Surgery. Although there were gallstones, there was no significant elevation of LFTs. His lipase did not suggest pancreatitis. Gallstones were likely not the source of his abdominal pain. Per Surgery, no surgical intervention was required during this hospitalization. He was started on PPI as well. He has had off and on nonspecific pains in his belly but he is tolerating his diet. He is not nauseous or vomiting and is moving his bowels. He should follow-up with his PCP  and possibly gastroenterologist as an outpatient. The patient did have bouts of back pain and flank pain as well. This was not mesenteric ischemia. He had multiple imaging studies which showed no fractures. Rib series on the left and the right are negative for rib fractures and MRI of the lumbar spine was done which, again, did not show any acute fractures. He will be discharged with a limited amount of Percocet and outpatient follow-up with his PCP for further evaluation. In regards to his diastolic CHF, this appears to be chronic at this point. He should follow with his [facility] nephrologist for dialysis on his regularly scheduled days, the next one which is tomorrow.   TOTAL TIME SPENT: 35 minutes.    CODE STATUS: The patient is FULL CODE.   ____________________________ Krystal Eaton, MD sa:drc D: 01/03/2012 11:49:06 ET T: 01/04/2012 10:57:26 ET JOB#: [id number]  cc: Krystal Eaton, MD, <Dictator> Krystal Eaton MD ELECTRONICALLY SIGNED 01/21/2012 11:05

## 2014-05-29 NOTE — Consult Note (Signed)
Chief Complaint:   Subjective/Chief Complaint Continues to complain of abdominal pain which is diffuse and severe. US showed gallstones and minimal pericholecystic fluid.   VITAL SIGNS/ANCILLARY NOTES: **Vital Signs.:   19-Nov-13 14:15   Vital Signs Type Upon Transfer   Temperature Temperature (F) 98   Celsius 36.6   Pulse Pulse 85   Respirations Respirations 16   Systolic BP Systolic BP 277   Diastolic BP (mmHg) Diastolic BP (mmHg) 85   Mean BP 111   Pulse Ox % Pulse Ox % 91   Pulse Ox Activity Level  At rest   Oxygen Delivery 2L   Brief Assessment:   Additional Physical Exam Abdomen is distended and extremely tender to touch. Rebound is difficult to asses. Bowel sounds are negative.   Lab Results: Routine Chem:  19-Nov-13 03:18    Result Comment troponin - RESULTS VERIFIED BY REPEAT TESTING.  - prev called 11/18/13at 1352by vfm.  Result(s) reported on 29 Dec 2011 at 04:09AM.   Glucose, Serum 83   BUN  22   Creatinine (comp)  5.24   Sodium, Serum 143   Potassium, Serum 4.4   Chloride, Serum 104   CO2, Serum  33   Calcium (Total), Serum 9.0   Anion Gap  6   Osmolality (calc) 287   eGFR (African American)  13   eGFR (Non-African American)  11 (eGFR values <58m/min/1.73 m2 may be an indication of chronic kidney disease (CKD). Calculated eGFR is useful in patients with stable renal function. The eGFR calculation will not be reliable in acutely ill patients when serum creatinine is changing rapidly. It is not useful in  patients on dialysis. The eGFR calculation may not be applicable to patients at the low and high extremes of body sizes, pregnant women, and vegetarians.)   Magnesium, Serum 2.2 (1.8-2.4 THERAPEUTIC RANGE: 4-7 mg/dL TOXIC: > 10 mg/dL  -----------------------)  Cardiac:  19-Nov-13 03:18    Troponin I  3.50 (0.00-0.05 0.05 ng/mL or less: NEGATIVE  Repeat testing in 3-6 hrs  if clinically indicated. >0.05 ng/mL: POTENTIAL  MYOCARDIAL INJURY.  Repeat  testing in 3-6 hrs if  clinically indicated. NOTE: An increase or decrease  of 30% or more on serial  testing suggests a  clinically important change)   CK, Total 214   CPK-MB, Serum  10.1 (Result(s) reported on 29 Dec 2011 at 04:05AM.)  Routine Hem:  19-Nov-13 03:18    WBC (CBC)  3.2   RBC (CBC)  2.71   Hemoglobin (CBC)  9.1   Hematocrit (CBC)  26.5   Platelet Count (CBC)  131   MCV 98   MCH 33.4   MCHC 34.2   RDW  16.0   Neutrophil % 58.8   Lymphocyte % 16.4   Monocyte % 19.8   Eosinophil % 3.7   Basophil % 1.3   Neutrophil # 1.9   Lymphocyte #  0.5   Monocyte # 0.6   Eosinophil # 0.1   Basophil # 0.0 (Result(s) reported on 29 Dec 2011 at 03:41AM.)   Assessment/Plan:  Assessment/Plan:   Assessment Abdominal pain with abdominal distension as well as diffuse abdominal tenderness. Patient is at high risk for mesenteric ischemia. His WBC count is normal as well as heart rate and temperature. Cholecystitis is less likely due to diffuse natire of abdominal tenderness.    Plan Serum lactic acid stat. If abnormal will obtain CT with IV contrast or CTA. Clear liquids. Continue IV antibiotics. Appreciate surgical consult.  Electronic Signatures: Jill Side (MD)  (Signed [phone] 17:40)  Authored: Chief Complaint, VITAL SIGNS/ANCILLARY NOTES, Brief Assessment, Lab Results, Assessment/Plan   Last Updated: 19-Nov-13 17:40 by Jill Side (MD)

## 2014-05-29 NOTE — Consult Note (Signed)
Brief Consult Note: Diagnosis: Left lateral upper abdominal pain-intermittent for about 2 mos-reports severe abdominal pain now.   Patient was seen by consultant.   Consult note dictated.   Recommend further assessment or treatment.   Comments: Await CT results. Consider IV bid  Protonix w hx of PUD and risk for stress ulcer. Consider mesenteric ischemia/diverticulitis/doubt shingles-as no rash-but jumps and reports severe pain with light fingertip touch on skin. Recent appendectomy and surgical exploration of abdomen without obvious abnormality. Last BM 2 days ago. He reports colonoscopy [facility] years [facility]. Severe SOB and recent heart cath with MI, need for further CAD intervention on Ntg and Heparin drip. Received 300mg  Plavix yest. He is not a luminal candidate. Case d/w Dr. Jerre SimonEliott in collaboration of care.  Electronic Signatures: Rowan BlaseMills, Graycie Halley Ann (NP)  (Signed 19-Sep-13 17:56)  Authored: Brief Consult Note   Last Updated: 19-Sep-13 17:56 by Rowan BlaseMills, Jaselyn Nahm Ann (NP)

## 2014-05-29 NOTE — Discharge Summary (Signed)
PATIENT NAME:  Russell [name], Russell [name] MR#:  [id number] DATE OF BIRTH:  [birth year]  DATE OF ADMISSION:  01/17/2012 DATE OF DISCHARGE:  01/19/2012  REASON FOR ADMISSION: Recurrent chest pain.    DISCHARGE DIAGNOSES:  1. Stable angina/chest pain.  2. History of atrial fibrillation, on high-dose amiodarone.  3. History of diastolic dysfunction congestive heart failure, compensated.  4. Bronchitis, chronic obstructive pulmonary disease, chronic respiratory failure. The patient was started on Levaquin, now stopped.  5. Endstage renal disease on hemodialysis.  6. Tobacco abuse.  7. Hypertension.  8. High risk for admission.  9. Coronary artery disease on Plavix and aspirin.  10. Status post non-ST elevation myocardial infarction.  11. Status post coronary artery bypass grafting.  12. Hyperlipidemia.   DISPOSITION: Home.   MEDICATIONS AT DISCHARGE:  1. Metoprolol 25 mg twice daily.  2. Claritin 10 mg once daily.  3. Nephro-Vite once daily.  4. Aspirin 81 mg once daily.  5. Docusate 100 mg once daily.  6. Senna Plus 2 tablets once daily.  7. Plavix 75 mg once daily.  8. Losartan 100 mg once daily.  9. Diltiazem CD 120 mg once a day. 10. Theophylline 100 mg extended-release once a day. 11. Acetaminophen with oxycodone 325/5 mg every six hours as needed for pain.  12. Advair Diskus 250/50, 2 times a day.  13. Renagel 800-mg tablet 3 times a day.  14. Fosrenol 1000 mg once a day at bedtime.  15. Nicotine smoking device kit. 16. DuoNebs.  17. Nitroglycerin p.r.n.  18. Amiodarone 400 mg twice daily.  19. Isosorbide mononitrate 60 mg once a day.   FOLLOWUP:  1. Follow up with Dr. Nehemiah Massed in one week. The patient needs to address medications. Amiodarone is still at a high dose, 400 mg twice a day. Needs to be decreased at some point when stable and authorized by Dr. Nehemiah Massed. He needs to continue to adjust medications for angina and blood pressure.  2. Follow up with kidney doctor and PCP   in two weeks.   HOSPITAL COURSE:  Russell [name] is a nice 64 year old gentleman who has had multiple admissions within the past months. He was recently admitted on 09/16 and discharged on 09/24, admitted back again on 10/05 and discharged on 10/20, admission again on 11/18 until 11/24, and then 12/02 until 12/05, then readmitted on 12/06 until today. Please refer to previous discharge summaries for previous admissions. The patient on this admission was seen by Dr. Loletha Grayer. Please refer to his History and Physical as well. He is a 64 year old gentleman who was just recently discharged from the hospital the day before admission, apparently did not fill his medications upon discharge from the hospital and the day of admission went to dialysis and started having significant chest pain, tightness of the chest, left-sided, that lasted for an hour while getting dialysis, and it was 4 to 5/10 in intensity. The dialysis nurses decided to send the patient to the hospital for evaluation of this problem.   During the weekend we decided to keep him here just to implement the plan so the patient does not continue to be readmitted. Evaluation was done by Dr. Ubaldo Glassing at the beginning who was trying to figure out if there is any reason to do a heart catheterization on this patient. He reviewed the data with Dr. Nehemiah Massed and they decided that there was not going to be any important benefits from this since the patient had a heart cath that was done in 10/2011  that showed PCA of the right coronary artery due to severe blockage. The patient has grafts that were patent.  Circumflex disease treated medically. Distal left main had 60% stenosis, proximal LAD 100% stenosis, proximal circumflex 50% stenosis, mid circumflex 99% stenosis, proximal right coronary artery  40% stenosis, mid right coronary artery 60% stenosis, and then a second lesion of 90% stenosis, distal RCA 60% stenosis, graft to mid LAD to LIMA. Graft angiography  showed no evidence of disease. Due to all these findings apparently the patient does not meet any criteria to do any more intervention at this moment. Dr. Kowalski decided to increase his Imdur to 60 mg instead of 30, and continue his amiodarone at the same dose of 400 mg twice a day.   The patient was pain-free during dialysis here in the hospital. He did not have any chest pain and is going to be discharged today in good condition. Another consideration was to possibly add Ranexa if the patient continues to have chest pain with increase in Imdur. The patient has been counseled about taking his medications. He has been counseled about followup with the doctors and continuation of his dialysis.   TIME SPENT: I spent about 45 minutes with this patient. We reviewed all his medications to make sure that he had refills for every single one of them, and we submitted the prescriptions to the pharmacy for Imdur and amiodarone so he can just pick them up on his way out of here.    ____________________________ Roberto Sanchez Gutierrez, MD rsg:bjt D: 01/19/2012 09:39:00 ET T: 01/19/2012 14:19:59 ET JOB#: [id number]  cc: Roberto Sanchez Gutierrez, MD, <Dictator> ROBERTO SANCHEZ GUTIERRE MD ELECTRONICALLY SIGNED 01/24/2012 14:09 

## 2014-05-29 NOTE — Discharge Summary (Signed)
PATIENT NAME:  Russell [name], [name] MR#:  [id number] DATE OF BIRTH:  17-Jul-1950  DATE OF ADMISSION:  10/28/2011  DISCHARGE:  To [place] 11/03/2011. [name].  DISCHARGE DIAGNOSES:    1. Chest pain with non-ST-elevation myocardial infarction status post cardiac catheterization.  2. The patient has heavy calcified lesions in the distal left main and also has heavy calcification in the coronary arteries and referred to [facility] by the cardiologist,  Dr. Kirke CorinArida.  3. End-stage renal disease, on hemodialysis.  4. Chronic obstructive pulmonary disease exacerbation, resolved.  5. Paroxysmal atrial fibrillation.  6. Hypertension.  7. Nausea and vomiting.   MEDICATIONS DURING THE TIME OF TRANSFER: 1. Nitro drip 70 mcg per minute.  2. Tylenol 650 mg every four hours p.r.n. for fever.  3. Aspirin 325 mg daily. 4. Vitamin D 3000 units daily.  5. Colace 100 mg p.o. b.i.d.  6. Advair Diskus 250/50 one puff twice a day. 7. .  8. Nephro-Vite 1 tablet p.o. daily.  9. Zofran as needed for nausea. 10. Pantoprazole 40 mg daily. 11. Senna 2 tablets daily.  12. Plavix 75 mg daily. 13. Nitroglycerin sublingual as needed. 0.4 mg for chest pain. .  [facility] for [facility] detai,.s 16. Levaquin 250 mg every 48 hours started on 09/22. 17. Morphine 1 to 2 mg every six hours p.r.n. for pain. 18. Norvasc 5 mg daily. 19. Hydralazine 50 mg every eight hours. 20. Reglan 5 mg p.o. t.i.d.  21. Lopressor 50 mg every 12 hours. 22. Prednisone 40 mg daily. He was on IV steroids decreased to oral steroids today and that can be tapered  over three days and this is again for chronic obstructive pulmonary disease exacerbation. 23. He is also on DuoNebs every four hours as needed.   CONSULTATION: Cardiology consult with Dr. Gwen PoundsKowalski initially and then second opinion with Dr Kirke CorinArida requested. Nephrology consult  with Dr. Mady HaagensenMunsoor Lateef and Dr. Thedore MinsSingh. GI consult with Dr. Mechele CollinElliott.   DIET: Low sodium, low phosphorus diet. The  patient's last hemodialysis was yesterday.   HOSPITAL COURSE: 1. The patient is a 64 year old male with history of coronary artery disease with CABG and history of atrial fibrillation who follows up with Dr. Gwen PoundsKowalski sent from  hemodialysis because of chest pain, nausea, vomiting, diaphoresis, while at dialysis, and the patient's blood pressure at that time was 60 systolic and he also was diaphoretic and patient's EKG showed ST depressions more pronounced than before in lateral leads  V4 to V6. The patient's initial set of troponins 0.12. The patient was taken to cardiac catheterization by Dr Gwen PoundsKowalski and the patient has significant stenosis in mid left circumflex and mid right coronary artery patent grafts and he thought to have acute subendocardial myocardial infarction and was started on heparin drip, nitro drip and continued on Plavix. The patient was seen by Dr. Gwen PoundsKowalski and second opinion with Dr Kirke CorinArida requested. Because of the complex lesion in the right coronary artery and the left complex, the [facility] is [facility]. The patient was kept in the Intensive Care Unit with IV nitro drip and heparin drip. The patient was on heparin drip from 19 to 23rd and stopped yesterday morning. The patient's nitroglycerin is still going on at 70 cg per minute. The patient initially was on telemetry and later on transferred  to Intensive Care Unit from 19 September because of continued chest pain. Today, the patient is on nitro drip, still having some mild chest pain. Dr Kirke CorinArida has  requested transfer [facility], the accepting physician is Dr. Patty Sermons at [facility] and Dr Kirke Corin made the arrangements. The patient will be going there.  2. Chronic obstructive pulmonary disease exacerbation. When he came here, he had some wheezing and the patient started on Levaquin, steroids, and nebulizers. His wheezing is resolved and I am  in the process of weaning steroids and he can be weaned off  in two days and  continue Levaquin to finish the course. Continue DuoNebs. Advised smoking cessation. 3. The patient has anemia of chronic kidney disease. Hemoglobin 88.3 on 18 of September, and he received 2 units of transfusion so far because of his acute myocardial infarction but he has no evidence of active bleeding. Hemoglobin is stable at 11.5 from yesterday labs and patient will have end-stage renal disease on hemodialysis, last dialysis was yesterday. The patient has some abdominal pain when he came. CT of the abdomen and pelvis  showed thickening of segment of distal ileum near terminal ileum. There is mild wall thickening the sigmoid colon which is nonspecific and the patient's abdominal pain resolved. Had some nausea, Reglan was  started. The patient had no nausea after Reglan and able to tolerate the diet and had a bowel movement this morning. The patient also was seen gastroenterologist Dr. Mechele Collin.  He recommended no GI procedures. The patient had serum electrophoresis which was in normal limits. The patient's condition is stable. For his blood pressure, he is on multiple medications better this morning 116/27, heart rate 94, saturations of about  95% on 3 liters.   GENERAL: He is stable for transfer to [facility] for complex cardiac procedure.   Time Spent ON discharge preparation: More than 30 minutes    ____________________________ Katha Hamming, MD sk:ljs D: 11/03/2011 10:06:00 ET T: 11/05/2011 09:53:17 ET JOB#: [id number]  cc: Katha Hamming, MD, <Dictator> Katha Hamming MD ELECTRONICALLY SIGNED 11/15/2011 14:23

## 2014-05-29 NOTE — Consult Note (Signed)
Brief Consult Note: Diagnosis: Abdominal pain and vomiting.   Patient was seen by consultant.   Comments: A. fibb with RVR on Cardiazem. Abdominal pain with vomiting. Positive RUQ pain. Gallstones on CT. Normal WBC count. History of pancreatitis. Amylase and lipase are unremarkable. Anemia. No clinical signs of GI bleeding and hemoglobin has been as low as 8.1 in the recent past.  Recommendations: Agree with PPI. OK to continue antiplatelet agents although DC plavix if signs of active bleeding. US to r/o cholecystitis. Will follow.  Electronic Signatures: Lurline DelIftikhar, Edelmiro Innocent (MD)  (Signed [phone]-Nov-13 19:00)  Authored: Brief Consult Note   Last Updated: 18-Nov-13 19:00 by Lurline DelIftikhar, Delories Mauri (MD)

## 2014-05-29 NOTE — Consult Note (Signed)
    Comments   Family contacts obtained from patient: Alphia KavaLarry JR - pt's son [phone], Hillard DankerDarrel - pt's brother [phone].   Called and spoke with pt's son. We discussed pt's decision for DNR. Son says that he agrees and will respect his father's wishes. Son says that pt has been talking about how tired he has been and son sees a decline. All questions answered. 15 minutes  Electronic Signatures: Ellary Casamento, Daryl EasternJoshua R (NP)  (Signed 10-Dec-13 10:27)  Authored: Palliative Care Phifer, Harriett SineNancy (MD)  (Signed 10-Dec-13 14:06)  Authored: Palliative Care   Last Updated: 10-Dec-13 14:06 by Phifer, Harriett SineNancy (MD)

## 2014-05-29 NOTE — Consult Note (Signed)
Brief Consult Note: Diagnosis: Acute Abd/COPD/CAD/Elevated enzymes.   Patient was seen by consultant.   Consult note dictated.   Recommend to proceed with surgery or procedure.   Orders entered.   Discussed with Attending MD.   Comments: IMP Acute Abd ERSD COPD SOB CAD Hx MI NQMI Smoking . PLAN Mod surgical risk Rec proceed with surgery Consider B-blockers pre -post op low dose Inhalers AntiBx Hold coumadin I do not rec further cardiac w/u pre-op I will follow post op.  Electronic Signatures: Dorothyann Pengallwood, Lorren Rossetti D (MD)  (Signed 26-Jul-13 17:01)  Authored: Brief Consult Note   Last Updated: 26-Jul-13 17:01 by Dorothyann Pengallwood, Lief Palmatier D (MD)

## 2014-05-29 NOTE — Consult Note (Signed)
PATIENT NAME:  Russell [name], Russell [name] MR#:  [id number] DATE OF BIRTH:  Mar 07, 1950  DATE OF CONSULTATION:  11/15/2011  REFERRING PHYSICIAN:  Dr. Gwen Pounds and [facility] CONSULTING PHYSICIAN:  Jacora Hopkins D. Jeremian Whitby, MD  INDICATION: Shortness of breath and angina.   HISTORY OF PRESENT ILLNESS: Mr. 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 He was sent home10 days ago from [facility].  He  reports that on the evening prior to admission he had some shortness of breath and chest discomfort. He is still having heaviness in his chest with nitroglycerin. Heaviness has been relatively persistent, shortness of breath with mildly elevated troponins. Chest x-ray shows mild pulmonary edema and he has persistent dyspnea. He had hemodialysis the day before admission but denies any leg edema. He had a tapering dose of prednisone for possible bronchitis.  He continues to smoke.  He had a complex intervention by Dr. Kirke Corin at [facility] for his right coronary artery. He has had significant disease in the circumflex but that was treated medically. The patient states he has had no significant improvement after the angioplasty.   REVIEW OF SYSTEMS: No blackout spells or syncope. No nausea or vomiting. No fever. No chills. No sweats. No weight loss. No weight gain. No hemoptysis or hematemesis. No bright red blood per rectum.   PAST MEDICAL HISTORY:  1. Coronary artery disease. 2. Non-Q-wave myocardial infarction. 3. Endstage renal disease.  4. Hypertension.  5. Hyperlipidemia.  6. Smoking.  7. Chronic obstructive pulmonary disease.  8. Pancytopenia.  9. Atrial fibrillation.  10. Anemia.    PAST SURGICAL HISTORY:  1. Dialysis shunt. 2. Angioplasty and stenting. 3. Coronary artery bypass surgery.   MEDICATIONS:   1. Prednisone taper.  2. Renagel. 3. Losartan 100 daily. 4. Lopressor 50 twice daily.  5. Nitroglycerin p.r.n.  6. Imdur 60 daily. 7. Fosrenol 2000 mg twice a day.  8. Hydrocodone p.r.n.  9. Hydralazine 50 mg t.i.d.  10. Advair 250/50, one puff twice a day.  11. Colace 100 mg twice a day.  12. Plavix 75 mg daily.  13. Vitamin D. 14. Nephro-Vite. 15. Aspirin 81 mg daily. 16. Amlodipine 10 mg daily.  17. Albuterol inhalers as needed.  18. Metoprolol. 19. Claritin. 20. Azithromycin 500 mg a day with dialysis Monday, Wednesday, Friday.   ALLERGIES: Statins.   FAMILY HISTORY: Negative.   SOCIAL HISTORY: Still smoking. Retired. Denies significant alcohol consumption.   PHYSICAL EXAMINATION:  VITAL SIGNS: Blood pressure 150/70, pulse 80, respiratory rate 18, afebrile.   HEENT: Normocephalic, atraumatic. Pupils equal and reactive to light.   NECK: Supple. No jugular venous distention, bruits, or adenopathy.   LUNGS: He has significant rales and rhonchi bilaterally.  Mild wheezing.  HEART: Distant. Regular rate and rhythm.   ABDOMEN: Positive bowel sounds. No rebound, guarding, or tenderness.   EXTREMITIES: Within normal limits.   NEUROLOGIC: Intact.   SKIN: Normal.   LABORATORY DATA: Sodium 144, potassium 4.5, chloride 106, bicarbonate 24, BUN 40, creatinine 5.96. Glucose 91. Troponins are negative. White count 3.9, hemoglobin 10.5, platelet count 142.   EKG: Normal sinus rhythm, nonspecific ST-T wave changes.   CHEST X-RAY: Cardiomegaly with mild failure.   ASSESSMENT:  1. Congestive heart failure.  2. Respiratory failure.  3. Bronchitis.  4. Coronary artery disease.  5. Angina.  6. Endstage renal disease. 7.  Anemia. 8. Smoking.   PLAN: Continue current medications, inhalers, steroid therapy. Continue BiPAP. We will try to diuresis. He may need an extra dose of dialysis to help with his symptoms. We will continue nitroglycerin therapy and anticoagulation.  Consider whether further intervention will be necessary of the circumflex.      We will continue to treat the patient medically for now.   ____________________________ Kaelen Brennan D. Juliann Paresallwood, MD ddc:bjt D: 11/15/2011 18:03:15 ET T: 11/16/2011 08:52:39 ET JOB#: [id number]  cc: Jarrius Huaracha D. Juliann Paresallwood, MD, <Dictator> Alwyn PeaWAYNE D Pate Aylward MD ELECTRONICALLY SIGNED 12/29/2011 12:46

## 2014-05-29 NOTE — Discharge Summary (Signed)
PATIENT NAME:  Russell [name], Russell [name] MR#:  [id number] DATE OF BIRTH:  09/14/1950  DATE OF ADMISSION:  09/04/2011 DATE OF DISCHARGE:  09/09/2011  PRIMARY CARE PHYSICIAN:  [facility].  FINAL DIAGNOSES:  1. Acute respiratory failure, which improved.  2. Elevated troponin with history of coronary artery disease.  3. Acute pulmonary edema and fluid overload.  4. End-stage renal disease.  5. Malignant hypertension.  6. Chronic obstructive pulmonary disease exacerbation.  7. Atrial fibrillation.  8. Gastroesophageal reflux disease.  9. Anemia of chronic disease and pancytopenia.  10. Secondary hyperparathyroidism.  11. Abdominal pain concerning for appendicitis status post laparoscopic appendectomy by surgical team.   REASON FOR ADMISSION: The patient was admitted 09/04/2011, discharged 09/09/2011. The patient initially admitted by the surgical service and taken to the Operating Room for the possibility of acute appendicitis. Postoperative diagnosis was a normal appendix. Medical consultation was done by Dr. Elpidio Anis on 07/26 seen for abdominal pain and left shoulder pain as a preop consultation. The patient had trouble coming off the ventilator and was on the ventilator the next day on 07/27 when I took over the course. The patient was extubated later in the day on the 27th after dialysis to remove fluid. The patient was transferred to the medical service by the surgeons secondary to not a surgical issue. His appendix was normal.   CONSULTATIONS DURING THE HOSPITAL COURSE:  1. Dr. Juliann Pares, cardiology. 2. Dr. Wynelle Link, nephrology. 3. Dr. Freda Munro, pulmonary.   LABORATORY, RADIOLOGICAL AND DIAGNOSTIC DATA: Pathology from the surgery showed normal appendix. Lipase 107. INR 0.9. EKG showed sinus rhythm, premature atrial complex. Chest x-ray showed cardiomegaly. Findings consistent with heart failure. Troponin borderline at 0.14. Glucose 86, BUN 22, creatinine 4.65, sodium 139, potassium 3.5,  chloride 102, CO2 28, calcium 8.1. Liver function tests normal range. Albumin 3.3. White blood cell count 3.0, hemoglobin and hematocrit 10.4 and 30.7, platelet count 94. CT scan of the abdomen and pelvis showed findings concerning for appendicitis. Surgical consultation recommended. Troponin borderline at 0.15. Chest x-ray: Interval intubation of the trachea. ABG showed a pH of 7.30, pCO2 57, pO2 157, bicarbonate 28, oxygen saturation 99.2. Next troponin borderline at 0.29. Hepatitis B surface antigen negative. Phosphorus 7.5. PTH 243. Repeat chest x-ray on 09/06/2011 showed bilateral pleural effusions, lower left lobe atelectasis, low grade congestive heart failure. Hemoglobin upon discharge 11.3, platelet count 106. White count 6.2.   HOSPITAL COURSE PER PROBLEM LIST:  For the patient's acute respiratory failure, most likely this was fluid overload with the operation receiving IV fluids in a dialysis patient. The patient was kept on the ventilator overnight after the surgery. Dialysis to remove fluid. Lasix given. The patient was extubated later in the day of July 27th. The patient was on oxygen via nasal cannula after that and then oxygen saturation was good upon discharge, no need for oxygen.  1. For the patient's elevated troponin, history of coronary artery disease, this believed secondary to being ill when he came into the hospital.  2. Acute pulmonary edema and fluid overload. Discharge instructions on congestive heart failure were done. The patient's last echocardiogram showed normal left ventricular function and that was done March 2013. Dialysis will help out with fluid management.  3. For the patient's end-stage renal disease, go back on dialysis on his usual schedule Monday, Wednesday, Friday with [facility]. Here in the hospital underwent dialysis on schedule.  4. For the patient's malignant hypertension, blood pressure medications needed to be titrated up. The patient's  blood pressure was  134/66 upon discharge. The patient on valsartan, amlodipine, metoprolol and increased dose of hydralazine.  5. For the patient's chronic obstructive pulmonary disease exacerbation, the patient received IV Solu-Medrol, nebulizers. Upon discharge will go home on Combivent inhaler, Advair Diskus and doxycycline to complete course.  6. For the patient's atrial fibrillation, on aspirin and metoprolol. The patient is no longer on Coumadin.  7. Gastroesophageal reflux disease. He is on Nexium.  8. For his anemia of chronic disease and pancytopenia, the patient did receive Procrit shots during the hospital course.  9. Secondary hyperparathyroidism. Nephrology following that.  10. Abdominal pain concerning for appendicitis. The patient had a laparoscopic appendectomy that showed a normal appendix. Pain control upon discharge for that with Norco.  TIME SPENT ON DISCHARGE: 35 minutes.    ____________________________ Herschell Dimesichard J. Renae GlossWieting, MD rjw:ap D: 09/09/2011 16:19:24 ET T: 09/10/2011 12:20:29 ET JOB#: [id number]  cc: Herschell Dimesichard J. Renae GlossWieting, MD, <Dictator> [facility]  Salley ScarletICHARD J Requan Hardge MD ELECTRONICALLY SIGNED 09/17/2011 13:41

## 2014-05-29 NOTE — Discharge Summary (Signed)
PATIENT NAME:  Russell [name], Russell [name] MR#:  [id number] DATE OF BIRTH:  11-21-1950  DATE OF ADMISSION:  11/27/2011 DATE OF DISCHARGE:  11/29/2011  DISCHARGE DIAGNOSES:  1. Acute pancreatitis, resolved. 2. History of chronic obstructive pulmonary disease.  3. Chronic diastolic heart failure.  4. Hypertension.  5. End-stage renal disease on hemodialysis Monday, Wednesday, and Friday.  6. Anemia of chronic disease. 7. Coronary artery disease with bypass surgery and also stent placement in September at [facility].  DISCHARGE MEDICATIONS: 1. Advair Diskus 250/50 one puff twice a day. 2. Isosorbide mononitrate 30 mg p.o. daily.  3. Metoprolol 25 mg p.o. twice a day. 4. Theophylline 100 mg p.o. daily.  5. Amlodipine 10 mg p.o. daily.  6. Aspirin 325 mg p.o. daily.  7. Plavix 75 mg daily.  8. Hydralazine 50 mg p.o. three times daily. 9. Losartan 100 mg p.o. daily.  10. Albuterol nebulizer 3 mL every four hours as needed.  11. Atrovent every four hours as needed via nebulizer.  12. Senna as needed for constipation.  13. Pantoprazole 40 mg p.o. daily. 14. Reglan 5 mg p.o. three times daily.  DIET: Low sodium, renal diet.   CONSULTANTS: Marcina Millard, MD - Cardiology.  LABORATORY, DIAGNOSTIC AND RADIOLOGIC DATA: Lipase on admission 1644. Magnesium 2.3.  INR 0.9. Electrolytes: Sodium 141, potassium 3.6, chloride 99, bicarbonate 26, BUN 92, creatinine 7.05, and glucose 109. LFTs: ALT 27, AST 14, alkaline phosphatase 161, and albumin 3.4. Troponin 0.14. White count 8.1.   Chest x-ray showed congestive heart failure and cardiomegaly with mild edema.   CAT scan of the abdomen showed a few loops of small bowel demonstrating mild bowel wall thickening, probably related to nonspecific enteritis. The patient has mild dilatation of main pancreatic duct in the region of the pancreatic head and body, nonspecific, similar to recent prior. The patient also had some anasarca with trace fluid in the  abdomen.   Troponin was up to 0.24 on 11/27/2011. Lipase was down to 555 yesterday.   HOSPITAL COURSE: This is a 64 year old male patient with significant past history of end-stage renal disease, coronary artery disease with multiple stents, history of hypertension, and recent stent procedure at [facility] in September, also admission again in the first week of October for chronic obstructive pulmonary disease exacerbation and treated for exacerbation and was discharged by Dr. Enid Baas to [facility] who came back because the patient developed abdominal pain with nausea. Look at the history and physical done on 11/27/2011 full details.  1. The patient was admitted for acute pancreatitis. The patient had some nausea before and the patient also was tachycardic. He was admitted for acute pancreatitis. He had a CAT scan showing nonspecific enteritis. The patient was started on pain medication. The patient was placed on observation. Pancreatic enzyme elevation is probably secondary to poor creatinine clearance with renal disease and also nonspecific pancreatitis. Lipase is coming down and he is tolerating the diet with no more nausea. The patient also had recent appendectomy. He can followup with his gastroenterologist in one week.  2. The patient had an elevated troponin and was seen by Dr. Darrold Junker who thinks it is likely secondary to poor creatinine clearance rather than acute coronary syndrome. The patient did not need any further cardiac work-up.  3. end-stage renal disease, on hemodialysis. He can follow up again with nephrology, Dr. Thedore Mins, on Monday for dialysis.  4. Chronic obstructive pulmonary disease with recent admission for flare up. He was admitted to [facility]  at that time and he was prescribed for Levaquin and prednisone, which are finished, and he is not wheezing anymore and afebrile so he can just stop them. The patient also can continue theophylline for COPD along with  Advair and nebulizers as needed.  5. Hypertension. Blood pressure is controlled with multiple medications. Today blood pressure is 128/65 and pulse is 74. He is on amlodipine, metoprolol, and hydralazine which can be continued.  6. Coronary artery disease with stenting. He is on aspirin and Plavix along with losartan and beta blockers, which can be continued. He can follow up with Dr. Gwen PoundsKowalski, his primary cardiologist. 7. Dyslipidemia, intolerant to statins.  8. Continued smoking. He was advised about quitting and counseled him. 9. GI prophylaxis with PPIs. The patient can follow up with Dr. Mechele CollinElliott. He was seen by Dr. Mechele CollinElliott on 10/29/2011. The patient can have possible luminal evaluation as an outpatient.   TIME SPENT: More than 30 minutes were spent on discharge preparation. ____________________________ Katha HammingSnehalatha Koren Plyler, MD sk:slb D: 11/29/2011 11:29:55 ET T: 11/30/2011 10:38:11 ET JOB#: [id number]  cc: Katha HammingSnehalatha Arihant Pennings, MD, <Dictator> Katha HammingSNEHALATHA Marcea Rojek MD ELECTRONICALLY SIGNED 12/12/2011 22:47

## 2014-05-29 NOTE — H&P (Signed)
PATIENT NAME:  Russell [name], Russell [name] MR#:  [id number] DATE OF BIRTH:  [birth year]  DATE OF ADMISSION:  01/15/2012  PRIMARY CARE PHYSICIAN/NEPHROLOGIST: Mady Haagensen, MD  CARDIOLOGIST: Arnoldo Hooker, MD  CHIEF COMPLAINT: Heart out of rhythm and chest pain.   HISTORY OF PRESENT ILLNESS: This is a 64 year old man who was just discharged from the hospital yesterday. He did not fill his medications upon discharge from the hospital yesterday. He went to dialysis today, felt his heart was out of rhythm, chest pain 8 out 10 in intensity, a tightness in the chest, left-sided, lasting pretty severe for about an hour and now down to 4 out over 10 in intensity. He has an itching feeling on his back and a sticking feeling in his hands. No shortness of breath, but he did have vomiting this morning prior to dialysis. Hot feeling but no sweating. Hospitalist services were contacted for further evaluation. In the emergency room, he was found to have a borderline troponin of 0.08 and an EKG that showed slight ST depression inferiorly and laterally.   PAST MEDICAL HISTORY:  1. Recent heart attack. 2. Coronary artery disease with CABG. 3. Atrial fibrillation. 4. History of congestive heart failure, acute on chronic diastolic. 5. Chronic respiratory failure. 6. Chronic obstructive pulmonary disease. 7. Bronchitis. 8. Tobacco abuse. 9. End-stage renal disease on hemodialysis Monday, Wednesday, and Friday.  10. Hypertension.  11. Hyperlipidemia.   PAST SURGICAL HISTORY:  1. Coronary artery bypass grafting in 2011. 2. Stent placed in September 2013 at [facility]. 3. Appendectomy. 4. Shoulder surgery. 5. Fistula placement x2.  6. Kidney transplant.   ALLERGIES: Intolerant to statins.   SOCIAL HISTORY: Smokes half pack per day. No alcohol. No drug use. Lives alone.   FAMILY HISTORY: Mother died of chronic kidney disease and had hypertension. Father was killed.  MEDICATIONS: (That the patient is supposed to  be taking)  1. Metoprolol 25 mg twice a day. 2. Claritin 10 mg p.o. daily.  3. Nephro-Vite 1 tablet daily.  4. Aspirin 81 mg daily.  5. Colace 100 mg daily.  6. Senna +2 tablets daily.  7. Plavix 75 mg daily.  8. Amlodipine 10 mg daily.  9. Losartan 100 mg p.o. daily.  10. Imdur 30 mg p.o. daily.  11. Diltiazem CD 120 mg daily.  12. Theophylline 100 mg daily.  13. Percocet 5/325 mg one tablet every 6 hours as needed.  14. Advair Diskus 250/50 one inhalation twice a day. 15. Nitroglycerin p.r.n. chest pain. 16. Amiodarone 400 mg twice a day. 17. Renagel 800 mg three times daily. 18. Fosrenol 1000 mg twice a day. 19. Levaquin 500 mg p.o. every 48 hours for two more doses. 20. DuoNeb nebulizer solution 3 mL three to four times a day.   REVIEW OF SYSTEMS: CONSTITUTIONAL: Positive for hot feeling. No fever or chills. Positive for fatigue. No weight loss. No weight gain. EYES: No problem with his eyes. EARS, Nose, Mouth, And Throat: Decreased hearing in the left ear. No trouble swallowing. No sore throat. CARDIOVASCULAR: Positive for chest pain. No palpitations. RESPIRATORY: Positive for coughing, brownish phlegm. No hemoptysis. No shortness of breath. GASTROINTESTINAL: Positive for nausea. Positive for vomiting. No abdominal pain. No diarrhea. No bright red blood per rectum. No melena. GENITOURINARY: No burning on urination. No hematuria. MUSCULOSKELETAL: No joint pain. INTEGUMENTARY: Positive for itching on his back. NEUROLOGIC: No fainting or blackouts. PSYCHIATRIC: No anxiety or depression. ENDOCRINE: No thyroid problems. HEMATOLOGIC/LYMPHATIC: No anemia.   PHYSICAL EXAMINATION:   VITAL  SIGNS: Currently temperature is 97.8, pulse 73, respirations 20, blood pressure 139/71, and pulse oximetry 99%, and that is measured on room air.   GENERAL: No respiratory distress.   EYES: Conjunctivae and lids normal. Pupils equal, round, and reactive to light. Extraocular muscles intact. No nystagmus.    EARS, NOSE, MOUTH, AND THROAT: Tympanic membranes no erythema. Nasal mucosa no erythema. Throat no erythema. No exudate seen. Lips and gums no lesions.   NECK: No JVD. No bruits. No lymphadenopathy. No thyromegaly. No thyroid nodules palpated.   RESPIRATORY: Decreased breath sounds bilaterally, coarse throughout. No wheeze or rhonchi or rales heard.   CARDIOVASCULAR: S1 and S2 normal. No gallops, rubs, or murmurs heard. Carotid upstroke 2+ bilaterally. No bruits. Dorsalis pedis pulses 1+ bilaterally. 2+ edema of bilateral lower extremities,   ABDOMEN: Soft and nontender. No organomegaly/splenomegaly. Normoactive bowel sounds. No masses felt.   LYMPHATIC: No lymph nodes in the neck.   MUSCULOSKELETAL: 2+ edema. No clubbing and no cyanosis.   SKIN: No ulcers seen.   NEUROLOGIC: Cranial nerves II through XII grossly intact. Deep tendon reflexes 2+ of bilateral lower extremities.   PSYCHIATRIC: The patient is oriented to person, place, and time.   LABS/RADIOLOGIC STUDIES: White blood cell count is 4.7, hemoglobin and hematocrit 9.4 and 28.1, and platelet count 150. Glucose 79, BUN 25, creatinine 4.34, sodium 137, potassium 4.2, chloride 101, CO2 29, and calcium 8.3. Liver function tests normal. Troponin borderline at 0.08.   Chest x-ray: Bilateral diffuse interstitial thickening likely representing interstitial edema versus interstitial pneumonitis secondary to infectious or inflammatory etiology.   ASSESSMENT AND PLAN:  1. Chest pain concerning for unstable angina. The patient does have a history of coronary artery disease. Last cardiac catheterization back in September 2013 did show disease. The case discussed with Dr. Lady GaryFath, cardiology, who will see the patient and make recommendations. We will admit as an observation initially to telemetry, get serial cardiac enzymes to rule out myocardial infarction, and continue aspirin, Plavix, and metoprolol. If cardiac enzymes really increase, we  will consider heparin drip and consider adding Ranexa. Borderline troponin. Could be false/positive with end-stage renal disease and abnormal EKG.  2. Atrial fibrillation, currently in normal sinus rhythm. Currently on high dose amiodarone. Hopefully can taper down shortly. 3. History of congestive heart failure. No signs of congestive heart failure currently. Continue current medications. Dialysis to help out with fluid management.  4. Bronchitis, chronic obstructive pulmonary disease, and chronic respiratory failure with last admission. Pulse oximetry currently 99% on room air. May be able to go home without oxygen. Continue the Levaquin and his inhalers. Respiratory status is stable at this point.  5. End-stage renal disease, on hemodialysis. Hemodialysis as per nephrology.  6. Tobacco abuse. The patient continues to smoke cigarettes despite multiple hospitalizations, heart disease, and chronic obstructive pulmonary disease. Smoking cessation counseling done, three minutes by me. Nicotine patch and Nicotrol inhaler prescribed.  7. Hypertension. Blood pressure currently stable on numerous medications, will continue.        8. Noncompliance with recently prescribed medications. May be part of the issue of this readmission within 24 hours. The patient states that he is able to do everything at home and able to go dialysis and not interested in any further placement. This is a high risk patient for further readmissions despite what we do to try to keep him out of the hospital.  TIME SPENT ON ADMISSION: 55 minutes.  ____________________________ Herschell Dimesichard J. Renae GlossWieting, MD rjw:slb D: 01/15/2012 13:55:24 ET  T: 01/15/2012 14:50:15 ET JOB#: [id number]  cc: Herschell Dimes. Renae Gloss, MD, <Dictator> Munsoor Lizabeth Leyden, MD Lamar Blinks, MD Salley Scarlet MD ELECTRONICALLY SIGNED 01/15/2012 16:24

## 2014-05-29 NOTE — Consult Note (Signed)
Chief Complaint:   Subjective/Chief Complaint Pt intubated and sedated. S/P surgery.   VITAL SIGNS/ANCILLARY NOTES: **Vital Signs.:   27-Jul-13 13:00   Temperature Temperature (F) 97.6   Celsius 36.4   Temperature Source oral   Pulse Pulse 70   Respirations Respirations 40   Systolic BP Systolic BP 631   Diastolic BP (mmHg) Diastolic BP (mmHg) 497   Mean BP 144   Pulse Ox % Pulse Ox % 98   Oxygen Delivery Ventilator Assisted; spont   Pulse Ox Heart Rate 240  *Intake and Output.:   27-Jul-13 09:00   Grand Totals Intake:  20.9 Output:      Net:  20.9 24 Hr.:  50.9   IV (Primary)      In:  10   IV (Secondary)      In:  10.9   Brief Assessment:   Cardiac Regular  murmur present  -- thrills  -- LE edema  -- JVD    Respiratory normal resp effort  clear BS  wheezing  rhonchi    Gastrointestinal Normal    Gastrointestinal details normal Soft  Nontender  Nondistended  No masses palpable    Additional Physical Exam s/p surgery   Lab Results: Lab:  27-Jul-13 00:50    pH (ABG)  7.30   PCO2  57   PO2  157   FiO2 30   Base Excess 0.4   HCO3  28.0   O2 Saturation 99.2   O2 Device 840   Specimen Site (ABG) RT RADIAL   Specimen Type (ABG) ARTERIAL   Patient Temp (ABG) 37.0   Mode SIMV   Vt 500   PEEP 5.0   Mechanical Rate 14 (Result(s) reported on 05 Sep 2011 at 12:59AM.)    02:45    pH (ABG)  7.47   PCO2  34   PO2 87   FiO2 30   Base Excess 1.5   HCO3 24.7   O2 Saturation 97.2   O2 Device 840   Specimen Site (ABG) RT RADIAL   Specimen Type (ABG) ARTERIAL   Patient Temp (ABG) 37.0   Mode SIMV   Vt 500   PSV 20   PEEP 5.0   Mechanical Rate 18 (Result(s) reported on 05 Sep 2011 at 02:52AM.)  Routine Chem:  27-Jul-13 04:37    Glucose, Serum  122   BUN  30   Creatinine (comp)  6.28   Sodium, Serum 141   Potassium, Serum 4.8   Chloride, Serum 103   CO2, Serum 25   Calcium (Total), Serum 8.8   Anion Gap 13   Osmolality (calc) 289   eGFR (African  American)  10   eGFR (Non-African American)  9 (eGFR values <51m/min/1.73 m2 may be an indication of chronic kidney disease (CKD). Calculated eGFR is useful in patients with stable renal function. The eGFR calculation will not be reliable in acutely ill patients when serum creatinine is changing rapidly. It is not useful in  patients on dialysis. The eGFR calculation may not be applicable to patients at the low and high extremes of body sizes, pregnant women, and vegetarians.)   Result Comment TROPONIN - RESULTS VERIFIED BY REPEAT TESTING.  - CALLED TO CHRIS LARSON AT 0708   - BY GA ON 09/05/11  - READ-BACK PROCESS PERFORMED.  Result(s) reported on 05 Sep 2011 at 07:08AM.    12:50    Phosphorus, Serum  7.5 (Result(s) reported on 05 Sep 2011 at 03:02PM.)  Cardiac:  27-Jul-13  04:37    Troponin I  0.29 (0.00-0.05 0.05 ng/mL or less: NEGATIVE  Repeat testing in 3-6 hrs  if clinically indicated. >0.05 ng/mL: POTENTIAL  MYOCARDIAL INJURY. Repeat  testing in 3-6 hrs if  clinically indicated. NOTE: An increase or decrease  of 30% or more on serial  testing suggests a  clinically important change)  Routine Hem:  27-Jul-13 04:37    WBC (CBC)  3.7   RBC (CBC)  3.33   Hemoglobin (CBC)  10.6   Hematocrit (CBC)  31.7   Platelet Count (CBC)  96   MCV 95   MCH 31.7   MCHC 33.3   RDW  16.3   Neutrophil % 83.2   Lymphocyte % 8.5   Monocyte % 7.0   Eosinophil % 0.3   Basophil % 1.0   Neutrophil # 3.1   Lymphocyte #  0.3   Monocyte # 0.3   Eosinophil # 0.0   Basophil # 0.0 (Result(s) reported on 05 Sep 2011 at 05:19AM.)   Radiology Results: XRay:    26-Jul-13 10:10, Chest Portable Single View   Chest Portable Single View    REASON FOR EXAM:    Chest Pain  COMMENTS:       PROCEDURE: DXR - DXR PORTABLE CHEST SINGLE VIEW  - Sep 04 2011 10:10AM     RESULT: Comparison is made to the study of Jun 10, 2011.    Sternotomy wires and CABG changes are present. There is mild  diffuse  interstitial edema. There is no significant effusion or pneumothorax. The   cardiac silhouette is enlarged. Cardiac monitoring electrodes are present.    IMPRESSION:     1. Cardiomegaly with findings consistent with congestive heart failure.   2. Mild interstitial edema is present.  Thank you for the opportunity to contribute to the care of your patient.     Dictation Site: 2          Verified By: Sundra Aland, M.D., MD    27-Jul-13 00:04, Chest Portable Single View   Chest Portable Single View    REASON FOR EXAM:    post intubation  COMMENTS:       PROCEDURE: DXR - DXR PORTABLE CHEST SINGLE VIEW  - Sep 05 2011 12:04AM     RESULT: Comparison is made to the study of 04 September 2011.    There has been interval intubation of the trachea with the tipof the   tube at the level of the superior margin of the clavicular heads. The   lungs are well-expanded. The interstitial markings remain mildly   increased. The left hemidiaphragm is partially obscured now. The cardiac   silhouette is top normal insize.    IMPRESSION:  There has been interval intubation of the trachea with   reasonable positioning of the endotracheal tube.    Verified By: DAVID A. Martinique, M.D., MD  Lab:    27-Jul-13 00:50, ABG   pH (ABG) 7.30   PCO2 57   PO2 157   FiO2 30   Base Excess 0.4   HCO3 28.0   O2 Saturation 99.2   O2 Device 840   Specimen Site (ABG)    RT RADIAL   Specimen Type (ABG) ARTERIAL   Patient Temp (ABG) 37.0   Mode SIMV   Vt 500   PEEP 5.0   Mechanical Rate 14   Result(s) reported on 05 Sep 2011 at 12:59AM.    27-Jul-13 02:45, ABG   pH (ABG) 7.47  PCO2 34   PO2 87   FiO2 30   Base Excess 1.5   HCO3 24.7   O2 Saturation 97.2   O2 Device 840   Specimen Site (ABG)    RT RADIAL   Specimen Type (ABG) ARTERIAL   Patient Temp (ABG) 37.0   Mode SIMV   Vt 500   PSV 20   PEEP 5.0   Mechanical Rate 18   Result(s) reported on 05 Sep 2011 at 02:52AM.  CT:     26-Jul-13 13:52, CT Abdomen and Pelvis Without Contrast   CT Abdomen and Pelvis Without Contrast    REASON FOR EXAM:    (1) sudden onse severe abdominal pain; (2) sudden   onset abdominal pain  COMMENTS:       PROCEDURE: CT  - CT ABDOMEN AND PELVIS W0  - Sep 04 2011  1:52PM     RESULT: CT of the abdomen pelvis dated 09/04/2011.    Technique: Helicalnoncontrasted 3 mm sections were obtained from the   lung bases through the pubic symphysis.    Findings: Evaluation of the lung bases demonstrates prominence of   interstitial markings and trace bilateral effusions. Multichamber cardiac   enlargement is identified.    Noncontrast evaluation of the liver, spleen, adrenals, pancreas is   unremarkable. Evaluation of the kidneys demonstrate multiple small low   attenuating foci scattered throughout the kidneys like reflecting   polycystic kidney disease. Coarse vascular calcifications identified   within the mesenteric vessels. Kidney is identified within the right   lateral portion of the pelvis consistent with a transplant. Within the   limitations of a noncontrast CT there is no evidence of abdominal aortic   aneurysm.    Evaluation retrocolic gutter region demonstrates mild to moderate   thickening of the appendix measuring 8.5 mm in diameter. There is a mild   thickening of the appendiceal wall as well as multiple appendicoliths   within the appendix. Free fluid is appreciated surrounding the cecum and   the appendix in the anterior right pericolic gutter region. These   findings raise concern of appendicitis and clinical correlation is     recommended as well as surgical consultation. The appendix appears to   rest just anterior to the iliacus muscle.    There is no CT evidence of bowel obstruction.    IMPRESSION:   1. Findings concerning for appendicitis. Surgical consultation   recommended if clinically warranted.  2. Findings consistent with renal failure and subsequent renal  transplant   in the pelvis on the right.          Verified By: Mikki Santee, M.D., MD   Assessment/Plan:  Invasive Device Daily Assessment of Necessity:   Does the patient currently have any of the following indwelling devices? foley  ventilator    Indwelling Urinary Catheter continued, requirement due to    Reason to continue Indwelling Urinary Catheter for strict Intake/Output monitoring for hemodynamic instability    Ventilator continued, requirement due to    Reason to continue Ventilator failed daily assessment of readiness to wean   Assessment/Plan:   Assessment IMP Resp failure COPD Bronchospasm POd 1 abdominal surgery CAD Hx MI Bradycardia    Plan PLAN continue post care Inhalers Hold off on b-blockers Pul input to help with vent care Consider ECHO I do not rec cath Conservative cardiology input   Electronic Signatures: Lujean Amel D (MD)  (Signed 28-Jul-13 10:08)  Authored: Chief Complaint, VITAL SIGNS/ANCILLARY NOTES, Brief Assessment,  Lab Results, Radiology Results, Assessment/Plan   Last Updated: 28-Jul-13 10:08 by Lujean Amel D (MD)

## 2014-05-29 NOTE — Consult Note (Signed)
    Comments   Consult received for 64 yo man with multiple medical problems including ESRD, CAD/CABG, recent MI, afib, diastolic dysfunction and h/o CHF, COPD, ongoing tobacco abuse. Discharged yesterday after hospitalized with chest pain. Sent from dialysis with shortness of breath and CP. Came by room to see patient. He is in HD. Will followup if still here on Monday.   Electronic Signatures: Borders, Daryl EasternJoshua R (NP)  (Signed 06-Dec-13 16:14)  Authored: Palliative Care   Last Updated: 06-Dec-13 16:14 by Malachy MoanBorders, Joshua R (NP)

## 2014-05-29 NOTE — Consult Note (Signed)
PATIENT NAME:  Russell [name], SHIRAISHI MR#:  [id number] DATE OF BIRTH:  January 05, 1951  DATE OF CONSULTATION:  09/04/2011  CONSULTING PHYSICIAN:  Noell Lorensen R. Jaysun Wessels, MD  PRIMARY CARE PHYSICIAN: None.   PRIMARY NEPHROLOGIST: Dolphus Jenny, MD    CARDIOLOGIST: Arnoldo Hooker, MD   CHIEF COMPLAINT: Abdominal pain and left shoulder pain.   HISTORY OF PRESENT ILLNESS: The patient is a 64 year old African American male patient with history of coronary artery disease, coronary artery bypass graft at [facility] in 2012, end-stage renal disease on hemodialysis, hypertension, recent non ST-elevation myocardial infarction in May 2013, presents to the Emergency Room sent in from his dialysis center with complaints of abdominal pain and left shoulder and chest pain. The patient in the Emergency Room complained of severe right lower quadrant pain, had a CT scan of the abdomen done which showed acute appendicitis. His pain in the left shoulder and left chest is pleuritic, tenderness on pressing, with no shortness of breath, palpitations, syncope. The patient had his cardiac enzymes checked here which shows elevated troponin of 0.14 with CK of 271 and MB 6.4. The patient recently had his troponin of 2.02 in May 2013.   The patient was seen by Dr. Gwen Pounds of Cardiology in May 2013 who said the patient had an non ST-elevation myocardial infarction, likely subendocardial, but he did not suggest any cardiac catheterization. Today, his EKG shows no acute abnormalities. He does have some occasional PACs and nonspecific ST-T wave changes which are chronic.   The case was discussed with Dr. Egbert Garibaldi, of Surgery, who will be admitting the patient and has consulted the Hospitalist Service. I have paged Dr. Gwen Pounds of Cardiology as the patient is a high risk for surgery and would need a Cardiology opinion prior to his surgery with his recent non ST-elevation myocardial infarction.[name]    PAST MEDICAL HISTORY:  1. End-stage renal disease on  hemodialysis for 10 years with a fistula in his left upper extremity.  2. 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  3. Hypertension.  4. Chronic obstructive pulmonary disease.  5. Chronic atrial fibrillation.  6. Coronary artery disease with coronary artery bypass last at [facility].   ALLERGIES: Statins.   PAST SURGICAL HISTORY:  1. Coronary artery bypass graft in 2010.  2. AV fistula placement.  3. Cataract surgery.   FAMILY HISTORY: Reviewed and noncontributory.   HOME MEDICATIONS:  1. Advair Diskus 250/50 inhalations inhaled b.i.d.  2. Amiodarone 200 mg b.i.d. orally.  3. Amitriptyline 100 mg, 1 tablet oral once a day. 4. Aspirin 81 mg oral once a day. 5. Claritin 10 mg oral once a day.  6. Colace 100 mg oral once a day.  7. Combivent 18 mcg/103 mcg, 1 to 2 puffs inhaled every 4 to 6 hours.  8. Coumadin 6 mg oral once a day.  9. Hydralazine 1 tablet oral t.i.d.  10. Metoprolol 25 mg 1 tablet oral b.i.d.    11. Nephro-Vite 1 tablet oral once a day.  12. Neurontin 300 mg oral t.i.d.   13. Nexium 40 mg oral b.i.d.   14. Norco 5/325, 1 tablet oral every 6 hours as needed for pain.  15. Senokot 8.6 mg oral 2 tablets once a day.   REVIEW OF THE SYSTEMS: Constitutional, eyes, ENT, respiratory, cardiovascular, gastrointestinal, genitourinary, endocrine, hematology, lymphatic, integumentary musculoskeletal, neurology, psychiatry are reviewed and negative except the ones mentioned in history of present illness.   PHYSICAL EXAMINATION:  VITAL SIGNS: Temperature of 98, pulse of 68 and regular, respirations  15, blood pressure 177/81, saturating 97% on room air.  GENERAL: Obese African American male patient lying in bed in distress secondary to his abdominal pain, holding his abdomen.   PSYCHIATRIC: Alert and oriented x3. Mood and affect appropriate, judgment intact.   HEENT: Atraumatic, normocephalic. Oral mucosa moist and pink. External ears and nose normal. No pallor. No icterus. Pupils  bilaterally equal and react to light.   NECK: Supple. No thyromegaly. No palpable lymph nodes. Trachea midline. No carotid bruit or JVD.   CARDIOVASCULAR: S1, S2 with a systolic murmur. Peripheral pulses 2+. Has AV fistula in his left upper extremity. No edema.   RESPIRATORY: Bilateral wheezing with good air entry on both sides.   GASTROINTESTINAL: Soft abdomen with significant tenderness and voluntary guarding in his right lower quadrant and rebound tenderness positive. Bowel sounds present. No hernias.   SKIN: Warm and dry, no petechiae, rash, ulcers.   MUSCULOSKELETAL: No joint swelling, redness, or effusion of the large joints. Normal muscle tone.   NEUROLOGICAL: Motor strength 5 out of 5 in upper extremities, 4 out of 5 in right lower extremity and 5 out of 5 in left lower extremity. Sensation decreased in the lower extremities.   LABORATORY, DIAGNOSTIC AND RADIOLOGICAL DATA: Laboratory studies show BUN 22, creatinine 4.65, albumin 3.3, CK of 271, CK-MB 6.4, troponin 0.14. WBC 3, hemoglobin 10.4, with platelets of 94, INR 0.9.  CT scan shows acute appendicitis.  A chest x-ray shows cardiomegaly with findings consistent with congestive heart failure, mild interstitial edema.  EKG shows normal sinus rhythm, PACs, no acute abnormalities compared to prior EKG.   ASSESSMENT AND PLAN:  1. Acute appendicitis: The patient is being admitted by Dr. Egbert GaribaldiBird. As for his surgery, the  patient would be at least a moderate to high risk considering his recent non ST-elevation myocardial infarction. I have paged Dr. Gwen PoundsKowalski and put in a stat consult. I am waiting for him to call back. The patient will need to be seen Cardiology prior to his surgery. The patient does have a mildly elevated troponin at this time. I suspect this is secondary to his long-standing atrial fibrillation and some mild volume fluid overload secondary to missing his dialysis today. The patient is not on Plavix. He is on Coumadin, but  his INR is 0.9.  2. Chest pain, pleuritic: Likely secondary from his acute chronic obstructive pulmonary with his musculoskeletal. See above regarding elevated troponins.   3. Acute chronic obstructive pulmonary disease: The patient is actively wheezing. We will start him on some steroids and breathing treatments. We will put the patient on incentive spirometry. Chest x-ray shows no pneumonia. His wheezing could also be secondary to some mild fluid overload as he could not finish his dialysis earlier today.  4. Paroxysmal atrial fibrillation, on anticoagulation: Presently the patient is in normal sinus rhythm. Rate is 70. His INR is 0.9. We will wait for Cardiology input for anticoagulation recommendations at surgery.  5. End stage renal disease, on hemodialysis: Consult Nephrology for further needs.  6. Hypertension: Mildly elevated likely secondary to his pain. We will start him on IV medications while he is n.p.o. for the surgery.  7. Deep vein thrombosis: We will start him on TEDS at this time as he is going for surgery. Further deep vein thrombosis prophylaxis as per Dr. Egbert GaribaldiBird.   CODE STATUS:  FULL CODE.     TIME SPENT: Time spent today in coordination of care was 65 minutes with more than 50% time spent  in coordination of care.  ____________________________ Molinda Bailiff. Kourtnei Rauber, MD srs:cbb D: 09/04/2011 16:00:47 ET T: 09/04/2011 17:07:30 ET JOB#: [id number]  cc: Wardell Heath R. Elpidio Anis, MD, <Dictator> Gwenevere Abbot, MD Lamar Blinks, MD Redge Gainer Egbert Garibaldi, MD Orie Fisherman MD ELECTRONICALLY SIGNED 09/09/2011 14:01

## 2014-05-29 NOTE — Consult Note (Signed)
CC: pain in left side.  Pt with tenderness over left lateral ribs.  No abd pain at this time.  No abd tenderness.  He has had pneumonia twice in the recent past, now with abnormal CXR, CT was very non specific for any GI problems.  Will order SPIE to rule out multiple myeloma.  Consider bone scan when feasible.  Certainly no GI procedures recommended at this time. Dr. Candace Cruise is on call this weekend but will not see unless you call about a problem.  Electronic Signatures: Manya Silvas (MD)  (Signed on 20-Sep-13 16:00)  Authored  Last Updated: 20-Sep-13 16:00 by Manya Silvas (MD)

## 2014-05-29 NOTE — H&P (Signed)
PATIENT NAME:  Russell [name], Russell [name] MR#:  [id number] DATE OF BIRTH:  03/08/1950  DATE OF ADMISSION:  11/14/2011  REASON FOR ADMISSION: Shortness of breath.   HISTORY OF PRESENT ILLNESS: Russell [name] is a 64 year old African American male with a history of end-stage kidney disease on hemodialysis, coronary artery disease with history of CABG and recently discharged from [facility] on 09/25 after presenting to [facility] with non-ST segment elevation myocardial infarction who was sent home ten days ago from [facility]. He reports that one evening prior to admission he developed shortness of breath which was occurring at rest during the day yesterday. He started having with heaviness in his chest yesterday evening but did not take any nitroglycerin. His heaviness has been persistent and he came to the Emergency Room today with increased shortness of breath and was found to have elevated troponins and a chest x-ray concerning for pulmonary edema. His last dialysis was yesterday. He denies any lower extremity edema. He does still make some urine. He was discharged home on a prednisone taper but is not sure what dose he was taking. He has continued to smoke about 1/2 pack a day. He has not seen any of his physicians in follow up except for hemodialysis appointment yesterday. He states that he did have his full hemodialysis session yesterday. Patient's primary care doctor is unknown. He states that his regular cardiologist is Dr. Arnoldo Hooker of [facility], however, he was seen by Dr. Kirke Corin during recent [facility] admission and Dr. Kirke Corin organized the transfer to [facility] and did a PCI on patient the 25th of September at [facility].   PAST MEDICAL HISTORY:  1. Coronary artery disease, status post non-ST segment elevation myocardial infarction presumed secondary to demand ischemia with results of last catheterization unknown. History of CABG with LIMA to the LAD, saphenous vein graft to the diagonal and saphenous  vein graft to the OM1 with 90% mid left circumflex stenosis after OM1 with 60% left main stenosis distally and 99% RCA. It is unclear whether the RCA was able to be opened during most recent catheterization.  2. End-stage kidney disease on hemodialysis. Patient's last dialysis was in [facility] yesterday. He is followed by Dr. Darrick Penna.  3. History of hypertension.  4. Dyslipidemia with intolerance to statins.  5. Ongoing tobacco abuse.  6. Chronic obstructive pulmonary disease on bronchodilator therapy.  7. History of pancytopenia.  8. History of atrial fibrillation with recent episode of RVR, now in sinus rhythm.  9. History of anemia of chronic kidney disease requiring 2 units of packed red blood cells which were transfused during [facility] admission in September. Hemoglobin at time of discharge from [facility] was 11.5.   MEDICATIONS:  1. Patient was discharged on a prednisone taper starting on 09/26 starting at 30 mg and decreasing x10 mg every three days. Apparently he finished his prednisone taper today.  2. Renagel 400 mg 3 times daily with meals. 3. Losartan 100 mg daily.  4. Lopressor 50 mg twice daily.  5. Nitroglycerin tablets as needed for chest pain. 6. Imdur 60 mg daily.  7. Fosrenol 2000 mg twice daily with meals. 8. Hydrocodone as needed for pain.  9. Hydralazine 50 mg 3 times daily.  10. Advair 250/50, 1 puff b.i.d.  11. Colace 100 mg twice daily.  12. Plavix 75 mg daily.  13. Vitamin D 20,000 units, unclear how often. 14. Nephro-Vite 1 tablet daily.  15. 81 mg of aspirin daily.  16. Amlodipine has been increased  to 10 mg daily.  17. Albuterol inhaler as needed for shortness of breath.  18. Metoprolol tartrate 25 mg once daily.  19. Claritin 10 mg daily.  20. Azithromycin 500 mg once daily.  21. His dialysis schedule is Monday, Wednesdays and Fridays at [facility], phone number [phone].  ALLERGIES: Statin intolerance only.   FAMILY HISTORY: Unknown, except  to say that his father and mother are deceased and he has three remaining siblings that are alive.   PAST SURGICAL HISTORY:  1. History of CABG as previously outlined.  2. History of percutaneous transluminal coronary angioplasty 09/26 at [facility].  3. Last hospitalization 09/19 at [facility] for chest pain, transferred to [facility] for a complicated cardiac catheterization.   SOCIAL HISTORY: He continues to smoke 1/2 pack a day. He does not drink alcohol.   REVIEW OF SYSTEMS: Negative for fever and weight changes. No recent visual changes. He does report some tinnitus in the left ear and ear pain which has been going on for one week. He has had some seasonal rhinitis for the past week. He denies sinus pain, epistaxis, and ear discharge. He denies difficulty swallowing. Respiratory review of systems is positive for wheezing without hemoptysis or sputum. He does have a history of chronic obstructive pulmonary disease. Cardiovascular review of systems is positive for chest pain and orthopnea. He denies edema and palpitations. GI review of systems is negative for nausea, vomiting, diarrhea and abdominal pain. He does continue to make urine and he denies any dysuria. He has no history of polyuria, nocturia, or thyroid problems. He does have a history of anemia secondary to chronic kidney disease but denies any recent bleeding. He has no history of changes in mole, hair or skin. He denies any current knee, back, shoulder, or hip pain. Neurological review of systems is negative for numbness, weakness, dysarthria, and seizures. He has had a mild headache with upper respiratory symptoms for the past several days. He has no history of anxiety or insomnia.   PHYSICAL EXAMINATION:  GENERAL: This is a well nourished elderly male who is in mild respiratory distress.   VITAL SIGNS: Admission blood pressure 148/74, pulse 72 and regular, temperature 98.1, oxygen saturations on room air are not recorded,  he is 96% on 2 liters.   HEENT: Pupils are equal, round, and reactive to light with some arcus senilis noted. Sclerae are nonicteric. Oropharynx is benign. Dentition is poor.   NECK: Supple without lymphadenopathy, JVD, or thyromegaly.   LUNGS: Notable for wheezes in the bilateral posterior fields. No rales appreciated. No egophony.   CARDIOVASCULAR: Regular rate and rhythm with no murmurs, rubs, or gallops. Chest wall is nontender. There is mild nonpitting lower extremity edema. He does have a left AV fistula with a good pulse.   ABDOMEN: Soft, nontender, and nondistended with no masses or hepatosplenomegaly.   MUSCULOSKELETAL: Patient's strength was not tested. He is moving all extremities.   SKIN: Warm and dry without rashes or lesions.   LYMPH: There is no cervical, axillary, inguinal, or supraclavicular lymphadenopathy.   NEUROLOGICAL: Grossly nonfocal with intact cranial nerves and sensation. He is not dysarthric. He is alert and oriented to person, place, and time.   LABORATORY, DIAGNOSTIC AND RADIOLOGICAL DATA: Sodium 144, potassium 4.5, chloride 106, bicarbonate 24, BUN 40, creatinine 5.96, glucose 91. Hepatic enzymes are normal. White count 3.9, hemoglobin 10.5, platelets 142. Last hemoglobin prior to transfer was 11.5 on 09/25. BNP is elevated at 33,000. 12-lead  EKG shows normal sinus rhythm with left ventricular hypertrophy, no ST or T wave changes currently. PA and lateral chest x-ray shows cardiomegaly with alveolar and interstitial prominence bilaterally somewhat increased from 09/26.   ASSESSMENT AND PLAN:  1. Acute respiratory failure, likely component of chronic obstructive pulmonary disease exacerbation and pulmonary edema given his BNP. Will admit for empiric antibiotic therapy, IV steroids and bronchodilators and management of chest pain with nitroglycerin paste, cycling of cardiac enzymes and cardiology consultation. There is [facility] and no catheterization report. I sense from his discharge medications his disease is to be medically managed.  2. Coronary artery disease with multivessel disease and prior coronary artery bypass graft complicated by ongoing tobacco abuse. Troponin is elevated at 0.07 but he does have end-stage kidney failure. Will treat for acute myocardial infarction with Lovenox, aspirin, nitroglycerin and morphine and ask cardiology to see him again.  3. Anemia of chronic kidney disease. His hemoglobin has dropped from 11.5 to 10.5 in the last 10 days. However, he does not meet criteria for admission. We will follow his hemoglobin daily.  4. End-stage kidney disease. Patient received dialysis yesterday on his usual schedule of Monday, Wednesday, Friday. This is done in [place]. We will consult nephrology for evaluation while he is here. He does not appear to be volume overloaded at this time. I have not ordered a stat nephrology consult because he does not appear to need dialysis at this moment.  5. Tobacco abuse. Counseling given in ER. Nicoderm patches offered.  6. Hypertension. Currently patient takes losartan and metoprolol. He will be getting metoprolol every six hours due to chest pain protocol. Will adjust medications as needed. Continue amlodipine as well.   ESTIMATED TIME OF CARE: 40 minutes.  ____________________________ Duncan Dulleresa Earley Grobe, MD tt:cms D: 11/14/2011 16:44:19 ET T: 11/15/2011 07:31:02 ET JOB#: [id number]  cc: Duncan Dulleresa Yasmin Bronaugh, MD, <Dictator> Duncan DullERESA Osamu Olguin MD ELECTRONICALLY SIGNED 12/02/2011 12:40

## 2014-05-29 NOTE — H&P (Signed)
PATIENT NAME:  Russell [name], Russell [name] MR#:  [id number] DATE OF BIRTH:  1950-08-30  DATE OF ADMISSION:  12/28/2011  ER REFERRING PHYSICIAN: Dr. Ladona Ridgel.  PRIMARY CARE PHYSICIAN: The patient has no primary care physician.   NEPHROLOGIST: Dr. Cherylann Ratel.   CARDIOLOGIST: Dr. Gwen Pounds.   CHIEF COMPLAINT: Nausea, abdominal pain, diaphoresis.   HISTORY OF PRESENT ILLNESS: The patient is a 64 year old male with past medical history of multiple medical problems including ongoing smoking, chronic obstructive pulmonary disease, chronic diastolic congestive heart failure, hypertension, end-stage renal disease, status post CABG and PCI to RCA in September of 2013, who was last in [facility] with acute pancreatitis. Prior to that, he was admitted with acute respiratory failure due to chronic obstructive pulmonary disease exacerbation and chronic obstructive pulmonary disease and congestive heart failure exacerbation. In October, the patient was discharged to [facility]. The patient reports that he has been home for the last three weeks. Today he went for his scheduled dialysis, and 2.3 liters of fluid was removed. He tolerated dialysis well and completed it fully. When he was getting ready to leave the dialysis center, he developed sudden onset of chest pain, abdominal pain, became diaphoretic, felt  lightheaded and dizzy, and therefore was brought to the Emergency Room where he was found to be in atrial fibrillation with RVR. He had been started on a Cardizem drip. The ER physician also consulted. Dr. Kirke Corin, the cardiologist on call, because the patient had elevated troponin and some ST-T wave depressions on EKG.  Dr. Kirke Corin did not feel that the patient had acute myocardial infarction. He recommended continuation of Cardizem drip and heart rate control. The patient also reports that earlier today he felt a little sick on his stomach and threw up before going to the dialysis center. He is on aspirin and Plavix, and there  has been a fall in his hemoglobin and hematocrit. His hemoglobin in October was 12.9 and currently it is 9.0.    ALLERGIES: Statins. The patient is intolerant of statins.   MEDICATIONS: The patient did not bring a list of his medications. The list sent from the dialysis center is not current. As per his most recent discharge summary, he is taking the following medications:  1. Advair 250/50, 1 puff b.i.d.  2. Imdur 30 mg daily.  3. Lopressor 25 mg b.i.d.  4. Theophylline 100 mg daily.  5. Amlodipine 10 mg daily.  6. Aspirin 325 mg daily.  7. Plavix 75 mg daily.  8. Hydralazine 50 mg t.i.d.  9. Losartan 100 mg daily.  10. Albuterol nebulizer as needed.  11. Atrovent nebulizer as needed.  12. Senna as needed for constipation.  13. Protonix 40 mg daily.  14. Reglan 5 mg t.i.d.   PAST MEDICAL HISTORY:  1. The patient was last admitted to [facility] on 10/18 to 10/20 with acute pancreatitis.  2. Prior to that, he was admitted from 10/05 to 10/15 for acute respiratory failure due to chronic obstructive pulmonary disease exacerbation, pulmonary edema, acute on chronic diastolic congestive heart failure exacerbation.  3. Chronic obstructive pulmonary disease.  4. Chronic diastolic congestive heart failure.  5. Hypertension. 6. Anemia of chronic disease.  7. End-stage renal disease on dialysis Monday, Wednesday and Friday. 8. Coronary artery disease, status CABG and stent to RCA in September at [facility].  9. Ongoing tobacco abuse.  10. Hyperlipidemia: The patient is intolerant of statin.    SOCIAL HISTORY: The patient continues to smoke 1/2 pack per day. He denies any  alcohol or drug abuse. The patient was discharged to a skilled nursing facility last time but has been at home for the last three weeks. He lives at [facility].   FAMILY HISTORY: Mother had kidney failure and hypertension.    REVIEW OF SYSTEMS: CONSTITUTIONAL: Reports fatigue, weakness. EYES: Denies any blurred or double vision.  ENT: Denies any tinnitus, ear pain. RESPIRATORY: Reports some cough.  Has history of chronic obstructive pulmonary disease. CARDIOVASCULAR: Reports left-sided chest pain. CARDIOVASCULAR: Reports left-sided chest pain. Reports palpitations. GI: Reports nausea, vomiting, abdominal pain. GU: Denies any dysuria or hematuria. ENDOCRINE: Denies any polyuria or nocturia. HEME/LYMPH: Has history of anemia. INTEGUMENTARY: Denies any acne or rash. MUSCULOSKELETAL: Denies any swelling or gout. NEUROLOGICAL:   Denies any numbness or weakness. PSYCHIATRIC: Denies any anxiety or depression.   PHYSICAL EXAMINATION:  VITAL SIGNS: Temperature 97.9, heart rate 114, respiratory rate 26, blood pressure 82/65, pulse oximetry 100 percent.   GENERAL: The patient is a 64 year old male, chronically ill-appearing.   HEENT: Head: Atraumatic, normocephalic. EYES: There is pallor. No icterus or cyanosis. Pupils are equal, round and reactive to light and accommodation. ENT: Dry mucous membranes. No oropharyngeal erythema or thrush.   NECK: Supple. No masses. No JVD. No thyromegaly.   CHEST WALL: The patient has tenderness to palpation on the left side of his chest. Not using accessory muscles of respiration. No intercostal muscle retractions.   LUNGS: Bilaterally basilar crepitation. No wheezing or rhonchi.   CARDIOVASCULAR: S1 and S2 irregularly irregular. No murmur, rubs, or gallops.   ABDOMEN: Soft. No guarding, no rigidity. Hypoactive bowel sounds. The patient has severe tenderness to palpation in his right upper quadrant and epigastrium.   SKIN: No rashes or lesions.   PERIPHERIES: The patient 1+ pedal edema, 1+ pedal pulses.   MUSCULOSKELETAL: No cyanosis or clubbing.   NEUROLOGICAL: Awake, alert, oriented x3. Nonfocal neurological exam. Cranial nerves grossly intact.   PSYCHIATRIC: Normal mood and affect.   LABORATORY, DIAGNOSTIC AND RADIOLOGICAL DATA: CT of the abdomen shows no acute abnormalities. Chronic  renal disease, transplant kidney right lower quadrant. Cardiomegaly with CAD. Gallstones, no biliary distention. The pancreas is normal. Sigmoid diverticulosis. Abdominal x-ray shows no abnormality. Chest x-ray shows pulmonary edema. CK 251. Troponin 0.10, glucose 73, BUN 12, creatinine 2.16, sodium 140, potassium 3, chloride 13, CO2 27, calcium 8.2, bilirubin 0.5, alkaline phosphatase 72, ALT 18, AST 27, total protein 6.4, albumin 3.4. White count 4.2, hemoglobin 9, hematocrit 26.6, platelets 143. Amylase 116, lipase 150.    ASSESSMENT AND PLAN: A 64 year old male with history of chronic obstructive pulmonary disease, chronic diastolic congestive heart failure, end-stage renal disease, coronary artery disease, status post coronary artery bypass graft and PCI, paroxysmal atrial fibrillation, presents with nausea, abdominal pain, chest pain.   1. Atrial fibrillation with RVR: The patient has a history of paroxysmal atrial fibrillation. He is not on Coumadin because of history of anemia requiring blood transfusions in the past. He was in RVR and is currently on a Cardizem drip. We will continue him on the Cardizem drip for the time being. We will start him on Lopressor and beta blocker and try and wean off the Cardizem drip. I have discussed with Dr. Kirke Corin, who has recommended conservative management for the time being and followup with Dr. Gwen Pounds, who is his primary cardiologist.  2. Hypertension: The patient was very hypertensive on presentation and got 500 mL normal saline bolus. His blood pressure is currently improved. We will hold his hydralazine and Losartan  for the time being and continue his beta blocker and calcium channel blocker to try and control his heart rate better.  3. History of chronic diastolic congestive heart failure: The patient's chest x-ray shows pulmonary edema, however, the patient has tolerated complete dialysis today, and 2.3 liters of fluid was removed. He was hypotensive on  presentation. Clinically he does not appear to be in congestive heart failure. We will continue to monitor.  4. Abdominal pain with anemia: The patient was last admitted with pancreatitis, however, currently his amylase and lipase are normal. The CT shows no evidence of pancreatitis. He does have some gallstones and epigastric and right upper quadrant tenderness. His hemoglobin fell from 12.9 in October to 9.0. He is taking aspirin and Plavix. Differential diagnosis included esophagitis, gastritis versus  peptic ulcer disease. In view of use of two antiplatelets, we will  increase the dose of his PPI to twice a day, check stool guaiacs, get abdominal ultrasound and a GI consultation.  5. Thrombocytopenia: Appears to be chronic.  6. Elevated troponins: Possibly due to atrial fibrillation with rapid ventricular response versus poor clearance due to end-stage renal disease. On exam, the patient has tenderness to palpation on the left side of his chest, and the chest pain appears to be more musculoskeletal. We will continue his aspirin and Plavix, beta blocker, Imdur, and get a Cardiology consultation.  7. End-stage renal disease: The patient gets dialyzed Monday, Wednesday and Friday. He completed his dialysis today. We will obtain a Nephrology consultation for further management.  8. Smoking:councelled about cessation for more than three minutes, will provide with a nicotine patch.  I reviewed old medical records, discussed with Dr. Kirke CorinArida, discussed with the ED physician, discussed with the patient the plan of care and management.   CRITICAL CARE TIME SPENT: 50 minutes.  ____________________________ Darrick MeigsSangeeta Shaylie Eklund, MD sp:cbb D: 12/28/2011 17:05:40 ET T: 12/28/2011 17:42:32 ET JOB#: [id number]  cc: Darrick MeigsSangeeta Calyb Mcquarrie, MD, <Dictator> Darrick MeigsSANGEETA Shylyn Younce MD ELECTRONICALLY SIGNED 12/28/2011 22:05

## 2014-05-29 NOTE — H&P (Signed)
PATIENT NAME:  Russell [name], Russell [name] MR#:  [id number] DATE OF BIRTH:  [birth year]  DATE OF ADMISSION:  10/28/2011  PRIMARY CARE PHYSICIAN: None local.  REFERRING PHYSICIAN: Arnoldo HookerBruce Kowalski, MD  CHIEF COMPLAINT: Chest pain today.   HISTORY OF PRESENT ILLNESS: The patient is a 64 year old African American male with a history of coronary artery disease status post CABG in 2012, endstage renal disease, hypertension, and hyperlipidemia who presented to the ED with chest pain on the left side today during dialysis. The patient has chest pain on the left side which is intermittent, 7 out of 10, nonradiating, and associated with nausea, vomiting, and diaphoresis during dialysis. The patient was sent to the ED for further evaluation and was noted to have a troponin of 0.12 and EKG shows atrial fibrillation with heart rate of 120, ST depression in leads V4 to V6. He was treated with aspirin and also treated with potassium 40 milliequivalents due to low potassium of 2.8. the patient got emergent cardiac catheterization by Dr. Gwen PoundsKowalski. Dr. Gwen PoundsKowalski called me [facility]. He suggested medical management.   PAST MEDICAL HISTORY:  1. Hypertension.  2. Hyperlipidemia.  3. End stage renal disease. 4. Coronary artery disease. 5. Chronic obstructive pulmonary disease. 6. Chronic atrial fibrillation.   PAST SURGICAL HISTORY:  1. Coronary artery bypass graft.  2. Cataract surgery.   SOCIAL HISTORY: He has smoked one pack a day for more than 40 years. No alcohol and no drugs.   FAMILY HISTORY: No heart attack, stroke, hypertension, or diabetes.   ALLERGIES: Statin.  MEDICATIONS: Please refer to the medication reconciliation list.   REVIEW OF SYSTEMS: CONSTITUTIONAL: The patient denies any fever or chills but has headache, dizziness, and generalized weakness. EYES: No double vision or blurred vision. ENT: No postnasal drip, epistaxis, or slurred speech. CARDIOVASCULAR:  Positive for chest pain and palpitation, but no orthopnea or nocturnal dyspnea and no leg edema. PULMONARY: No cough, sputum, shortness of breath, or hematemesis. GASTROINTESTINAL: No abdominal pain but has nausea, vomiting, and diaphoresis. No melena or bloody stool. GENITOURINARY: No dysuria, hematuria, or incontinence. SKIN: No rash or jaundice. HEMATOLOGY: No easy bruising or bleeding. NEUROLOGY: No syncope, loss of consciousness, or seizure.   PHYSICAL EXAMINATION:   VITALS: Temperature 97.3, blood pressure 101/69, and pulse 82. However, after cardiac catheterization, the patient's heart rate increased to 130 to 140 with atrial fibrillation with RVR. Oxygen saturation was 99%.   GENERAL: The patient is alert, awake, and oriented, in no acute distress.   HEENT: Pupils are round, equal, and reactive to light and accommodation. Moist oral mucosa. Clear oropharynx.   NECK: Supple. No JVD or carotid bruits. No lymphadenopathy. No thyromegaly   CARDIOVASCULAR: S1 and S2 irregular rate and rhythm, tachycardic. No murmurs or gallops.   PULMONARY: Bilateral air entry. No wheezing or rales. No use of accessory muscles to breathe.   ABDOMEN: Soft. No distention or tenderness. No organomegaly. Bowel sounds present.   EXTREMITIES: No edema, clubbing, or cyanosis. No calf tenderness. Strong bilateral pedal pulses.  SKIN: No rash or jaundice.   NEUROLOGIC: Alert and oriented x3. No focal deficits. Deep tendon reflexes mute. Power 5/5. Sensation intact.   LABORATORY, DIAGNOSTIC AND RADIOLOGIC DATA: Glucose 153, BUN 9, creatinine 2.27, sodium 145, potassium 2.8, chloride 105, and bicarbonate 30. CK 284 and CK-MB 5.9. WBC 2.6, hemoglobin 8.3, and platelets 92. Lipase 117. Magnesium 1.9. Phosphate 1.7. INR 1.1. Troponin 0.12.   Chest x-ray shows interstitial pulmonary edema.  Cardiac catheterization report shows acute subendocardial myocardial infarction with patent grafts, but significant stenosis of  MIC LCX and mid RCA.  EKG showed atrial fibrillation with RVR at 120 beats per minute. The patient's heart rate converted to normal sinus at 75 beats per minute just now.   IMPRESSION:  1. Non-ST-elevation myocardial infarction.  2. Coronary artery disease.  3. Atrial fibrillation with rapid ventricular response.  4. Hypokalemia.  5. End-stage renal disease.  6. Chronic obstructive pulmonary disease.  7. Tobacco abuse.  8. Pancytopenia.   PLAN OF TREATMENT: The patient will be admitted to the telemetry floor (NO CCU bed for now) to continue telemonitor, continue aspirin 325 mg p.o. daily and give Plavix 300 mg p.o. once today and continue 75 mg p.o. daily from tomorrow, and we will give morphine and nitroglycerin p.r.n. and continue losartan and Lopressor for hypertension and atrial fibrillation. We will follow up with Dr. Gwen Pounds and nephrology for dialysis.We will give additional dose of potassium 40 mEq and follow-up on BMP and CBC. For smoking cessation, he was counseled for five minutes. I discussed the patient's situation and the plan of treatment with the patient.   TIME SPENT: About 65 minutes.  ____________________________ Shaune Pollack, MD qc:slb D: 10/28/2011 15:22:03 ET T: 10/28/2011 15:49:20 ET JOB#: [id number]  cc: Shaune Pollack, MD, <Dictator> Shaune Pollack MD ELECTRONICALLY SIGNED 10/29/2011 15:20

## 2014-05-29 NOTE — Consult Note (Signed)
Brief Consult Note: Diagnosis: Atypical CP, borderline elevated troponin, elevated lipase, ? etiology.   Patient was seen by consultant.   Consult note dictated.   Comments: REC  Agree with current therapy, defer full dose anticoagulation, defer further cardiac diagnostics.  Electronic Signatures: Marcina MillardParaschos, Beatrice Ziehm (MD)  (Signed 19-Oct-13 09:40)  Authored: Brief Consult Note   Last Updated: 19-Oct-13 09:40 by Marcina MillardParaschos, Deb Loudin (MD)

## 2014-05-29 NOTE — Consult Note (Signed)
PATIENT NAME:  Russell [name], Russell [name] MR#:  [id number] DATE OF BIRTH:  [birth year]  DATE OF CONSULTATION:  12/29/2011  REFERRING PHYSICIAN:   CONSULTING PHYSICIAN:  Cristal Deer A. Arleene Settle, MD  REASON FOR CONSULTATION: Abdominal pain following dialysis.   HISTORY OF PRESENT ILLNESS: Russell [name] is an unfortunate 64 year old male with a past medical history which is extensive and includes coronary artery disease with recent myocardial infarction requiring stent in September 2013, renal failure, chronic obstructive pulmonary disease, congestive heart failure who per report was doing fine until Russell [name] went to dialysis yesterday and developed following dialysis acute onset of chest pain, abdominal pain, nausea and dizziness.  Russell [name] was found to be in atrial fibrillation with RVR. Russell [name] was admitted and was placed on Cardizem for rate control. Russell [name] says that Russell [name] threw up before Russell [name] went to the dialysis center and feels nauseated. Upon speaking with Russell [name] Russell [name] reports that Russell [name] pain is in Russell [name] left flank and Russell [name] has had it there over a month and that it has gotten worse. It radiates down to Russell [name] leg as well. Says that just kind of comes and goes, nothing specific makes it better, nothing specific makes it worse. Does have worse pain with movement though. No fevers, chills, night sweats, shortness of breath. Positive for chest pain and flank pain. No recent diarrhea or constipation.   PAST MEDICAL HISTORY:  1. Status post appendectomy in July of this year.  2. History of pancreatitis earlier in this year.  3. Coronary artery disease status post RCA stent in September at [facility].  4. Hyperlipidemia.  5. Chronic obstructive pulmonary disease, still smoking.  6. Congestive heart failure.   HOME MEDICATIONS:  1. Advair.  2. Imdur.  3. Lopressor.  4. Theophylline.  5. Amlodipine.  6. Aspirin.  7. Plavix.  8. Hydralazine.  9. Losartan.  10. Albuterol.  11. Atrovent.  12. Senna.  13. Protonix.  14. Reglan.   SOCIAL  HISTORY: Smokes 1/2 pack a day. Has been at home. Lives at [facility]. Denies any drugs or alcohol.   FAMILY HISTORY: Mother had kidney failure and hypertension.   REVIEW OF SYSTEMS: Total 12 point review of systems is obtained. Positive pertinent negatives are as above.   PHYSICAL EXAMINATION:  VITAL SIGNS: Temperature 98.2, pulse 76, blood pressure 151/72, pulse ox 92 on 2 liters oxygen.   GENERAL: No acute distress, alert and oriented x3.   HEAD: Normocephalic, atraumatic.   EYES: No scleral icterus. No conjunctivitis.   FACE: No obvious trauma. Normal external nose. Normal external ears.   NECK: Soft, supple, no obvious masses   LUNGS: Clear to auscultation bilaterally.   CHEST: Lungs clear to auscultation, moving air well.   HEART: Regular rate and rhythm. No murmurs, rubs, or gallops.   ABDOMEN: Soft, diffusely tender but not reproducible, is quite jumpy, nondistended. No obvious focal area of pain, easily distractible.   EXTREMITIES: Moves all extremities well. Strength 5/5.   LABORATORY, DIAGNOSTIC AND RADIOLOGICAL DATA: Labs are significant for a current white cell count 3.2, hemoglobin and hematocrit 9.1 and 26.5, platelets 131, troponin 3.5, creatinine 5.24.   I have personally reviewed Russell [name] CT.  It shows gallstones. No obvious intra-abdominal processes, significant sigmoid diverticulosis without diverticulitis  ASSESSMENT AND PLAN: Russell [name] is a pleasant 64 year old with approximately one month of abdominal pain, etiology is yet unknown, however, Russell [name] focal tenderness is not consistent with acute cholecystitis. Okay with ultrasound, however, if patient still had cholecystitis would likely treat nonoperatively and  potentially place percutaneous drain if patient's condition worsens. Russell [name] has recently had an MI and has stents and is a poor operative candidate and at risk for MI. In addition, is on dual platelet agents.  Will continue to follow with you.    ____________________________ Si Raiderhristopher A. Giulliana Mcroberts, MD cal:cms D: 12/29/2011 09:23:16 ET T: 12/29/2011 09:42:53 ET JOB#: [id number]  cc: Cristal Deerhristopher A. Trevaun Rendleman, MD, <Dictator>  Jarvis NewcomerHRISTOPHER A Lochlann Mastrangelo MD ELECTRONICALLY SIGNED 12/30/2011 15:41

## 2014-05-29 NOTE — Consult Note (Signed)
CC: pain in left side.  He has extreme tenderness to palpation of left lateral lower ribs, much less to abdomen or left lateral abdomen.  No tenderness in his abdomen to deep cough  No hx of fall.  His CT today was nonspecific for any cause of his current  pain.  He is ruling in for a MI and is on heparin and plavix.  I suggest rheumatology consult for the rib pain and consider bone scan and a serum immunoelectrophoresis  to rule out myeloma. [facility], [facility] before his kidney transplant.No evidence of any specific GI pathologic process.  Electronic Signatures: Scot JunElliott, Robert T (MD)  (Signed on 19-Sep-13 18:35)  Authored  Last Updated: 19-Sep-13 18:35 by Scot JunElliott, Robert T (MD)

## 2014-05-29 NOTE — Consult Note (Signed)
PATIENT NAME:  Russell [name], Russell [name] MR#:  [id number] DATE OF BIRTH:  April 22, 1950  DATE OF CONSULTATION:  10/29/2011  REFERRING PHYSICIAN:  Dr. Allena Katz CONSULTING PHYSICIAN:  Lynnae Prude, MD/Kerrick Miler A. Arvilla Market, ANP  REASON FOR CONSULTATION: Abdominal pain.   HISTORY OF PRESENT ILLNESS: This 64 year old African American male with complex medical history was admitted to the hospital yesterday after chest pain, shortness of breath event during dialysis. He underwent an acute cardiac catheterization by Dr. Gwen Pounds that showed subendocardial myocardial infarction, and there was discussion regarding medical management and possible PCI of the mid left circumflex artery, which would be complicated. The patient had heart rate control of atrial fibrillation with metoprolol. He is resting in the Critical Care Unit at this time and complains of severe left-sided abdominal pain. GI has been asked to see the patient regarding this left-sided abdominal pain.   The patient points to the left upper lateral abdomen and reports he has had pain there for several months. He says he had his appendix out last month because of this pain. The patient states the pain is intermittent, at times severe, but he is unable to describe very well or note any type of triggers. He thinks he has had the pain intermittent for about two months. It is a crampy ache-type pain. He is having a bowel movement every day, formed. He has noted no improvement in pain with a bowel movement. He denies change in bowel habits. The patient has had a normal diet and has not had problems with nausea, vomiting until his acute chest pain event yesterday during dialysis. The patient denies weight loss. He thinks he had a colonoscopy done at [facility] 3 or 4 years ago with negative findings. The patient is a poor historian.  Currently, he has some bloating, reports severe left side abdominal pain, points to the lateral area of the left upper quadrant. He has not  had a bowel movement for two days. He has had no vomiting today, does not feel hungry. He has a left-sided chest discomfort, intermittent, as well and reports severe shortness of breath. The patient has declined drinking the oral contrast for the CT and as of now an IV contrast CT is being negotiated with plans for further testing in the next half hour.   PAST MEDICAL HISTORY:  1. Hypertension.  2. Hyperlipidemia.  3. End-stage renal disease, on hemodialysis.  4. Severe coronary artery disease.  5. Valvular heart disease.  6. Chronic atrial fibrillation.  7. Chronic obstructive pulmonary disease.  8. Tobacco abuse.  9. Peptic ulcer disease.  10. History of shingles.  11. History of chronic anemia with chronic leukopenia and thrombocytopenia.  12. History of secondary hyperparathyroidism followed by Nephrology.  13. Non-Q-wave myocardial infarction 06/2011.  14. History of pneumonia admission.  15. History of diverticulosis without diverticulitis, normal small bowel, normal gallbladder per laparoscopic appendectomy surgical report in July 2012.   PAST SURGICAL HISTORY:  1. Coronary artery bypass graft 07/01/2010.  2. Laparoscopic appendectomy with normal appendix 09/04/2010.  3. Right kidney transplant 2010, failed.  4. Right cataract surgery.  5. Left arm shunt for dialysis.   CURRENT MEDICATIONS: Per history of present illness.  The patient is unable to list medications.    ALLERGIES: Statins.   HABITS: Positive tobacco 1 pack per day.  Denies alcohol, past or present.   FAMILY HISTORY: Negative for colon cancer.   REVIEW OF SYSTEMS: The patient is too short of breath to have prolonged discussion. He says  he feels like he is cold, has noted no specific fever but positive chills. He denies weight loss. He says this chest pain is much less than it was on admission, but still there is some discomfort and he points to the left lower left breast, left upper lateral abdomen. He denies  any pain on the right abdomen. He denies any current nausea or vomiting. No appetite. Further GI history as noted.   PHYSICAL EXAMINATION:  VITAL SIGNS: Temperature 98.5, pulse 62, respirations 31, blood pressure 1106/73, 96% on 4 liters nasal cannula.   HEENT: Head is normocephalic. Conjunctivae are pale. Oral mucosa is dry and intact.   NECK: Supple. Trachea midline.   HEART: Heart tones regular.   CARDIAC: Regular rate and rhythm at this time, S1, S2 with positive murmur.   PULMONARY: Lungs with scattered wheezing, scattered crackles. He has tachypnea with speaking and at rest.   ABDOMEN: Soft, a little tight, bowel sounds are present. He reports exquisite pain on the left side just even with his skin that is being touched lightly with the fingertips, the patient jumps. There is no skin rash noted. Laparoscopic appendectomy site is well healed.   EXTREMITIES: Distal pulses are present. Right groin catheter site Band-Aid. Has old blood, scant, intact. No obvious ecchymosis.   SKIN: Warm and dry without rash or edema.   PSYCHIATRIC: The patient is anxious, tachypneic as noted, he appears to have tenderness out of proportion to the exam  of the abdomen.   NEUROLOGIC: Gait not evaluated. The patient has not moved about in the bed. Strength appears equal bilaterally.   LABORATORY, DIAGNOSTIC AND RADIOLOGICAL DATA: Admission blood work was notable for glucose of 153, BUN 9, creatinine 2.27, potassium 2.8 that has been supplemented to 4.3, calcium 7.9, phosphorus 1.7. Magnesium 1.9. Hemoglobin A1c less than 3.5, albumin 3.2. CPK-MB 5.9. Troponin elevated at 0.12. WBC 2.6, hemoglobin 8.3, platelet count 92, MCV 97, MCH 31.6. Pro time 14.6.  INR 1.1. PTT 35.6. The patient has received Plavix, and he is on a heparin drip with PTT 33.9. Chest x-ray, single view, performed 10/28/2011 is with cardiomegaly, mediastinum similar to prior. Prior median sternotomy. Bilateral interstitial opacities.  Opacities in the left lung are decreased in conspicuity from prior.   IMPRESSION/PLAN: This patient is a critically ill male with known coronary artery disease, and presents with  shortness of breath and very tender abdomen. He reports left abdominal lateral discomfort over the last two months or so, intermittent in severity, and a crampy achy pain. He has exquisite tenderness with very light touch. Prior history of shingles, no evidence of rash at this time. He reports previous colonoscopy unrevealing, but would need old records to confirm. No bowel movement in the last two days, but he reports bowel habits have been regular up until this point. He has noted no nausea or vomiting. He denies weight loss. Etiology of abdominal pain is unclear. He does have known diverticulosis, could be a possibility of diverticulitis now. He has multiple vascular problems with his circulation and continues to smoke, so consider mesenteric ischemia as well. A CT scan of the abdomen and pelvis has been ordered and will be performed shortly. Consider coverage for possible diverticulitis. The patient had 300 mg of Plavix yesterday is on a nitroglycerin drip, heparin drip as of yesterday. His cardiac catheterization site appears to be in the right groin and intact. There is a possibility of further angioplasty to his heart artery with recent subendocardial myocardial infarction  in May and again currently. This patient is high risk for sedation and luminal evaluation. Further GI recommendations pending results of the CT scan. The patient does have a history of peptic ulcer disease and we will make sure he is covered adequately for stress ulcer.   This case was discussed with Dr. Mechele CollinElliott in collaboration of care. Dr. Mechele CollinElliott is to see the patient later today.   These services provided by Cala BradfordKimberly A. Arvilla MarketMills, MS, APRN, BC, ANP, under  collaborative agreement with Scot Junobert T. Elliott, MD.   ____________________________ Ranae PlumberKimberly A.  Arvilla MarketMills, ANP kam:cbb D: 10/29/2011 17:47:04 ET T: 10/30/2011 09:50:29 ET JOB#: [id number]  cc: Cala BradfordKimberly A. Arvilla MarketMills, ANP, <Dictator> Ranae PlumberKimberly A. Suzette BattiestMills RN, MSN, ANP-BC Adult Nurse Practitioner ELECTRONICALLY SIGNED 11/03/2011 16:31

## 2014-05-29 NOTE — Consult Note (Signed)
Brief Consult Note: Diagnosis: 1. Acute appendicitis.   Patient was seen by consultant.   Consult note dictated.   Recommend further assessment or treatment.   Discussed with Attending MD.   Comments: 1. Acute appendicitis. Need surgery. But recent NSTEMI in may. WIll need to be seen by cardiology prior to surgery. Would be mod-high risk. 2. Acute COPD. Nebs, Steroids. No PNA. 3. HTN- IV meds while NPO. 4. ESRD- Consult nephro. 5. Elevated troponins likely frm ESRD and fluid overload. 6. Pleuritic chest pain MSK or COPD.  Electronic Signatures: Minette HeadlandSudini, Jafeth Mustin Reddy (MD)  (Signed 26-Jul-13 16:03)  Authored: Brief Consult Note   Last Updated: 26-Jul-13 16:03 by Minette HeadlandSudini, Lavaun Greenfield Reddy (MD)

## 2014-05-29 NOTE — H&P (Signed)
Subjective/Chief Complaint 3-4 days of severe abdominal pain    History of Present Illness 60 yom with 3-4 days of worsening rlq abdominal pain nausea and emesis.  Hx ESRF,  Hx of recent NQWMI (May 2013).  Developed chest and left shoulder pain on dialysis in addition to worsening RLQ pain.  Work-up in ER with CT scan concerning for acute appendicitis.  Previous hx of right sided kidney trasnplant 2010.  Hx of non-compliance.    Past History ESRD CAD atrial fibrillation pneumonia. COPD.    Past Medical Health Coronary Artery Disease   Past Med/Surgical Hx:  pneumonia:   htn:   dialysis:   esrd:   CABG (Coronary Artery Bypass Graft):   ALLERGIES:  Statins: Unknown  HOME MEDICATIONS: Medication Instructions Status  Advair Diskus 250 mcg-50 mcg inhalation powder 1 puff(s) inhaled 2 times a day Active  amitriptyline 100 mg oral tablet 1 tab(s) orally once a day (at bedtime) Active  Nephro-Vite oral tablet 1 tab(s) orally once a day Active  Colace 100 mg oral capsule 1 cap(s) orally once a day Active  Neurontin 300 mg oral capsule 1 cap(s) orally 3 times a day Active  amiodarone 200 mg oral tablet 2 tab(s) orally 2 times a day Active  aspirin 81 mg oral tablet 1 tab(s) orally once a day Active  Combivent 18 mcg-103 mcg/inh inhalation aerosol with adapter 1-2 puff(s) inhaled every 4 to 6 hours, As Needed- for Shortness of Breath  Active  Nexium 40 mg oral delayed release capsule 1 cap(s) orally 2 times a day Active  hydrALAZINE 50 mg oral tablet 1 tab(s) orally 3 times a day Active  metoprolol tartrate 25 mg oral tablet 1 tab(s) orally 2 times a day Active  Norco 5 mg-325 mg oral tablet 1 tab(s) orally every 6 hours, As Needed- for Pain  Active  senna 8.6 mg oral tablet 2 tab(s) orally once a day Active  Coumadin 6 mg oral tablet 1 tab(s) orally once a day Active  Claritin 10 mg oral tablet 1 tab(s) orally once a day Active   Family and Social History:   Family History  Non-Contributory    Place of Living Home   Review of Systems:   Subjective/Chief Complaint see above.    Cough No    Abdominal Pain Yes    Nausea/Vomiting Yes    Dysuria No    Medications/Allergies Reviewed Medications/Allergies reviewed   Physical Exam:   GEN disheveled, critically ill appearing    HEENT pale conjunctivae    NECK supple    RESP wheezing    VASCULAR ACCESS AV fistula present    ABD positive tenderness  denies Flank Tenderness  no liver/spleen enlargement  no hernia  distended  significant rlq and llq tenderness.    EXTR positive cyanosis/clubbing    SKIN normal to palpation    NEURO cranial nerves intact    PSYCH alert, A+O to time, place, person   Lab Results: Hepatic:  26-Jul-13 09:57    Bilirubin, Total -   Alkaline Phosphatase -   SGPT (ALT) - (12-78 NOTE: NEW REFERENCE RANGE 01/02/2011)   SGOT (AST) -   Total Protein, Serum -   Albumin, Serum -    11:57    Bilirubin, Total 0.5   Alkaline Phosphatase 84   SGPT (ALT) 14 (12-78 NOTE: NEW REFERENCE RANGE 01/02/2011)   SGOT (AST) 25   Total Protein, Serum 6.5   Albumin, Serum  3.3  Routine Chem:  26-Jul-13  09:57    Result Comment CBC - SAMPLE QUANTITY NOT SUFFICIENT FOR ANALYSIS  - CALLED JENNIFER IN ER FOR RECOLLECT.  - REORDERING.. LAB  Result(s) reported on 04 Sep 2011 at 10:49AM.   Result Comment TROPONIN - RECOLLECTED DUE TO SEVERE HEMOLYSIS METABOLIC PANEL - RECOLLECTED DUE TO SEVERE HEMOLYSIS CARDIAC PANEL - RECOLLECTED DUE TO SEVERE HEMOLYSIS  Result(s) reported on 04 Sep 2011 at 11:36AM.   Glucose, Serum -   BUN -   Creatinine (comp) -   Sodium, Serum -   Potassium, Serum -   Chloride, Serum -   CO2, Serum -   Calcium (Total), Serum -   Osmolality (calc) -   eGFR (African American) -   eGFR (Non-African American) - (eGFR values <37m/min/1.73 m2 may be an indication of chronic kidney disease (CKD). Calculated eGFR is useful in patients with stable renal  function. The eGFR calculation will not be reliable in acutely ill patients when serum creatinine is changing rapidly. It is not useful in  patients on dialysis. The eGFR calculation may not be applicable to patients at the low and high extremes of body sizes, pregnant women, and vegetarians.)   Anion Gap -   Lipase 107 (Result(s) reported on 04 Sep 2011 at 11:12AM.)    11:57    Result Comment TROPONIN - RESULTS VERIFIED BY REPEAT TESTING.  - RESULT CALLED TO DR.WOODRUFF AT 1300  - 09/04/11-DAC  - READ-BACK PROCESS PERFORMED.  Result(s) reported on 04 Sep 2011 at 01:01PM.   Glucose, Serum 86   BUN  22   Creatinine (comp)  4.65   Sodium, Serum 139   Potassium, Serum 3.5   Chloride, Serum 102   CO2, Serum 28   Calcium (Total), Serum  8.1   Osmolality (calc) 280   eGFR (African American)  15   eGFR (Non-African American)  13 (eGFR values <621mmin/1.73 m2 may be an indication of chronic kidney disease (CKD). Calculated eGFR is useful in patients with stable renal function. The eGFR calculation will not be reliable in acutely ill patients when serum creatinine is changing rapidly. It is not useful in  patients on dialysis. The eGFR calculation may not be applicable to patients at the low and high extremes of body sizes, pregnant women, and vegetarians.)   Anion Gap 9  Cardiac:  26-Jul-13 09:57    Troponin I - (0.00-0.05 0.05 ng/mL or less: NEGATIVE  Repeat testing in 3-6 hrs  if clinically indicated. >0.05 ng/mL: POTENTIAL  MYOCARDIAL INJURY. Repeat  testing in 3-6 hrs if  clinically indicated. NOTE: An increase or decrease  of 30% or more on serial  testing suggests a  clinically important change)   CK, Total -   CPK-MB, Serum - (Result(s) reported on 04 Sep 2011 at 11:36AM.)    11:57    Troponin I  0.14 (0.00-0.05 0.05 ng/mL or less: NEGATIVE  Repeat testing in 3-6 hrs  if clinically indicated. >0.05 ng/mL: POTENTIAL  MYOCARDIAL INJURY. Repeat  testing in 3-6 hrs  if  clinically indicated. NOTE: An increase or decrease  of 30% or more on serial  testing suggests a  clinically important change)   CK, Total  271   CPK-MB, Serum  6.4 (Result(s) reported on 04 Sep 2011 at 12:49PM.)  Routine Coag:  26-Jul-13 09:57    Prothrombin 13.0   INR 0.9 (INR reference interval applies to patients on anticoagulant therapy. A single INR therapeutic range for coumarins is not optimal for all indications; however, the  suggested range for most indications is 2.0 - 3.0. Exceptions to the INR Reference Range may include: Prosthetic heart valves, acute myocardial infarction, prevention of myocardial infarction, and combinations of aspirin and anticoagulant. The need for a higher or lower target INR must be assessed individually. Reference: The Pharmacology and Management of the Vitamin K  antagonists: the seventh ACCP Conference on Antithrombotic and Thrombolytic Therapy. GMWNU.2725 Sept:126 (3suppl): [id number]. A HCT value >55% may artifactually increase the PT.  In one study,  the increase was an average of 25%. Reference:  "Effect on Routine and Special Coagulation Testing Values of Citrate Anticoagulant Adjustment in Patients with High HCT Values." American Journal of Clinical Pathology 2006;126:400-405.)  Routine Hem:  26-Jul-13 09:57    WBC (CBC) -   RBC (CBC) -   Hemoglobin (CBC) -   Hematocrit (CBC) -   Platelet Count (CBC) -   MCV -   MCH -   MCHC -   RDW -    11:57    WBC (CBC)  3.0   RBC (CBC)  3.25   Hemoglobin (CBC)  10.4   Hematocrit (CBC)  30.7   Platelet Count (CBC)  94 (Result(s) reported on 04 Sep 2011 at 12:18PM.)   MCV 94   MCH 32.0   MCHC 33.9   RDW  16.5     Assessment/Admission Diagnosis 73 yom with multiple medical co-morbidities and acute appendictis chronic leukopenia and thrombocytopenia. ESRD on HD. recent NQWMI and elevated troponin.    Plan admit, appreciate medicine and cardiology evaluations. high risk for  surgical intervention. admit, npo, IV invanz, will allow Dr. Leanora Cover to evaluate and decide timing of appendectomy. total time spent 60 minutes.   Electronic Signatures: Sherri Rad (MD)  (Signed 26-Jul-13 18:58)  Authored: CHIEF COMPLAINT and HISTORY, PAST MEDICAL/SURGIAL HISTORY, ALLERGIES, HOME MEDICATIONS, FAMILY AND SOCIAL HISTORY, REVIEW OF SYSTEMS, PHYSICAL EXAM, LABS, ASSESSMENT AND PLAN   Last Updated: 26-Jul-13 18:58 by Sherri Rad (MD)

## 2014-05-29 NOTE — Consult Note (Signed)
PATIENT NAME:  Russell [name], Russell [name] MR#:  [id number] DATE OF BIRTH:  Feb 14, 1950  DATE OF CONSULTATION:  01/11/2012  REFERRING PHYSICIAN:  Dr. Berlinda LastSanchez Gutierrez  CONSULTING PHYSICIAN:  Marcina MillardAlexander Dionisia Pacholski, MD  PRIMARY CARE PHYSICIAN: None.   REASON FOR CONSULTATION: Atrial fibrillation and chest pain.   HISTORY OF PRESENT ILLNESS: The patient is a 64 year old gentleman with known history of coronary artery disease and end-stage renal disease on hemodialysis. Patient was recently hospitalized 12/28/2011 at which time he was diagnosed with atrial fibrillation with a rapid ventricular response with elevated troponin consistent with non-ST elevation myocardial infarction. Patient returns today with recurrence of chest tightness and shortness of breath and was noted to be in atrial fibrillation with a rapid ventricular rate. Patient received multiple Cardizem boluses which did not appear to improve his heart rate and the patient was started on amiodarone bolus and drip with rate control. Patient reports feeling better, though he does have some mild chest discomfort. Admission labs were notable for BUN and creatinine of 20 and 4.15, respectively. Troponin was borderline elevated at 0.08.   PAST MEDICAL HISTORY:  1. Known coronary artery disease status post bypass graft surgery 07/01/2010. 2. End-stage renal disease on chronic hemodialysis. 3. Recent history of atrial fibrillation with a rapid ventricular rate.  4. Hypertension.  5. Hyperlipidemia. 6. Status post stent right coronary artery 10/30/2011.  7. Chronic obstructive pulmonary disease.  8. History of acute pancreatitis 11/27/2011.   MEDICATIONS ON ADMISSION:  1. Aspirin 81 mg daily.  2. Amlodipine 10 mg daily.  3. Plavix 75 mg daily.  4. Diltiazem CD 120 mg daily.  5. Isosorbide mononitrate 30 mg daily.  6. Losartan 100 mg daily.  7. Metoprolol 25 mg daily.  8. Acetaminophen with hydrocodone p.r.n.  9. Advair Diskus 250/50, 1 puff b.i.d.   10. Claritin 10 mg daily.  11. Docusate 100 mg daily.  12. Nephro-Vite 1 daily.  13. Senna p.r.n.  14. Theophylline 100 mg daily.   SOCIAL HISTORY: [place] He continues to smoke half pack of cigarettes per day.   FAMILY HISTORY: No immediate family history for coronary artery disease or myocardial infarction.    REVIEW OF SYSTEMS: CONSTITUTIONAL: No fever or chills. EYES: No blurry vision. EARS: No hearing loss. RESPIRATORY: Patient has some chronic exertional dyspnea due to ongoing tobacco abuse. CARDIOVASCULAR: Chest tightness as described above. GASTROINTESTINAL: Patient has chronic nausea. Denies vomiting, constipation, diarrhea. GENITOURINARY: No dysuria or hematuria. ENDOCRINE: No polyuria or polydipsia. MUSCULOSKELETAL: No arthralgias or myalgias. NEUROLOGICAL: No prior history of stroke. PSYCHOLOGICAL: No depression or anxiety.   PHYSICAL EXAMINATION:  VITAL SIGNS: Blood pressure 136/68, pulse 74, respirations 40, temperature 97.5, pulse oximetry 95%.   HEENT: Pupils equal, reactive to light and accommodation.   NECK: Supple without thyromegaly.   LUNGS: Decreased breath sounds with expiratory wheezes.   CARDIOVASCULAR: Normal jugular venous pressure. Normal point of maximal impulse. Regular rate and rhythm. Normal S1, S2. No appreciable gallop, murmur, or rub.   ABDOMEN: Soft and nontender. Pulses were intact bilaterally.   MUSCULOSKELETAL: Normal muscle tone.   NEUROLOGIC: Patient is alert and oriented x3. Motor and sensory both grossly intact.   IMPRESSION: 64 year old gentleman with known coronary artery disease who presents with recurrent atrial fibrillation with a rapid ventricular rate. Patient has borderline elevated troponin which is likely due to demand supply ischemia. Patient initially did not respond to diltiazem but appears to be responding to amiodarone bolus and drip. Patient has a  CHADS2 score of 2 and would benefit from  being on chronic anticoagulation.   RECOMMENDATIONS:  1. Agree with overall current therapy.  2. Continue amiodarone drip for now. Will likely convert to oral amiodarone as patient remains clinically stable.  3. Will discuss starting chronic anticoagulation. Patient currently is on aspirin and clopidogril which does reduce his risk for stroke.  4. Continue to cycle cardiac enzymes. I suspect the elevated troponin is due to demand supply ischemia and at this point would not recommend proceeding to cardiac catheterization.   ____________________________ Marcina Millard, MD ap:cms D: 01/11/2012 16:43:45 ET T: 01/12/2012 10:08:37 ET JOB#: [id number]  cc: Marcina Millard, MD, <Dictator> Marcina Millard MD ELECTRONICALLY SIGNED 01/22/2012 14:28

## 2014-05-29 NOTE — Consult Note (Signed)
General Aspect 64 yo male with history of multiple medical problems including cad s/p cabg with pci of the rca in 9/13, history of end stage renal disease on chronic hemodialysis, history of continued tobacco abuse, history of recent admissions for pancreatitis and respiratory failure, afib with rapid ventricular response, abdominal pain and chest pain who was resently discharged form [facility] after admission with abdominal and chest pain  who now presents to the er after noting complaints of  chest pain with dialysis.He has stabilized and states he is pain free a preent. It is of note that he had not taken any of his medicaitons in the 24 hours after his discharge. He has improved after being placed back on his meds. Cardaic cath several months ago revealed patent bypass grafts with moderate left circumflex disease felt best treated medically. He has been admittted several times since than with chest pain. He is currently hemodnaically stable.   Physical Exam:   GEN no acute distress    HEENT PERRL, hearing intact to voice    NECK supple    RESP clear BS  no use of accessory muscles    CARD Irregular rate and rhythm  Murmur    Murmur Systolic    Systolic Murmur axilla    ABD denies tenderness  normal BS  no Abdominal Bruits    LYMPH negative neck    EXTR negative cyanosis/clubbing, positive edema    SKIN normal to palpation    NEURO cranial nerves intact, motor/sensory function intact    PSYCH poor insight   Review of Systems:   Subjective/Chief Complaint chest pain    General: Fatigue  Weakness    Skin: No Complaints    ENT: No Complaints    Eyes: No Complaints    Neck: No Complaints    Respiratory: Short of breath    Cardiovascular: Chest pain or discomfort  Tightness    Gastrointestinal: No Complaints    Genitourinary: No Complaints    Vascular: No Complaints    Musculoskeletal: No Complaints    Neurologic: No Complaints    Hematologic: No Complaints     Endocrine: No Complaints    Psychiatric: No Complaints    Review of Systems: All other systems were reviewed and found to be negative    Medications/Allergies Reviewed Medications/Allergies reviewed     CHF:    Atrial Fibrillation:    Bronchitis:    MI - Myocardial Infarct:    pneumonia:    htn:    dialysis:    Stent placement 11/05/2011:    Appendectomy:    CABG (Coronary Artery Bypass Graft):   Home Medications: Medication Instructions Status  DuoNeb 2.5 mg-0.5 mg/3 mL inhalation solution 3 mL inhaled 4 times a day x 30 days Active  levofloxacin 500 mg oral tablet 1 tab(s) orally every 48 hours Active  nicotine 14 mg/24 hr transdermal film, extended release 1 patch transdermal once a day Active  nicotine 10 mg inhalation device 1 inhaler inhaled every hour, As Needed-nicotine craving Active  Fosrenol 1000 mg oral tablet, chewable 1 tab(s) orally in the morning and at bedtime x 30 days Active  Renagel 800 mg oral tablet 1 tab(s) orally 3 times a day x 30 days Active  amiodarone 400 mg oral tablet 1 tab(s) orally 2 times a day Active  acetaminophen-oxyCODONE 325 mg-5 mg oral tablet 1 tab(s) orally every 6 hours, As Needed- for Pain  Active  nitroglycerin 0.4 mg sublingual tablet 1 tab(s) sublingual every 5  minutes, As Needed- for Chest Pain  Active  theophylline 100 mg/24 hours oral capsule, extended release 1 cap(s) orally once a day Active  Dilt-CD 120 mg/24 hours oral capsule, extended release 1 cap(s) orally once a day Active  isosorbide mononitrate 30 mg oral tablet, extended release 1 tab(s) orally once a day Active  metoprolol tartrate 25 mg oral tablet 1 tab(s) orally 2 times a day Active  Advair Diskus 250 mcg-50 mcg inhalation powder 1 puff(s) inhaled 2 times a day Active  Claritin 10 mg oral tablet 1 tab(s) orally once a day Active  Nephro-Vite oral tablet 1 tab(s) orally once a day Active  aspirin 81 mg oral tablet 1 tab(s) orally once a day Active   docusate sodium 100 mg oral capsule 1 cap(s) orally once a day Active  Senna Plus 50 mg-8.6 mg oral tablet 2 tab(s) orally once a day Active  clopidogrel 75 mg oral tablet 1 tab(s) orally once a day Active  amlodipine 10 mg oral tablet 1 tab(s) orally once a day Active  losartan 100 mg oral tablet 1 tab(s) orally once a day Active   EKG:   Interpretation afib with rvr    Statins: Unknown    Impression 64 yo male with history of end stage renal disease on chronic hemodialysis, history of cad s/p cabg in the past with pci of his rca in 9/13, history of cardiomyopathy, history of respiratory failure and pancreatitis who presented to the er with complaints of chest pian with dialysis. He has chest pain with almost every dialysis in the past several months prompting miultiple admissions. He has had some problems with compliance with his medications. He had cardiac cath in 9/13 with patent grafts and circ disese treated medically. Was discharged only 24 hours ago. Did not take any meds during that time and had recurrent chest pain with dialysis prompting readmission. Will need to carefully review cath film from 9/13 to determine if there would be any consideration for pci.    Plan 1. COnitnue with resumed meds from recent discharge 2. Rule out for mi 3. Hemodialysis 4. Review cath films from prior workup 5  medical compliance disussed 6. Discontiue tobacco 7. Furhter recomendations pending course.   Electronic Signatures: Dalia Heading (MD)  (Signed 07-Dec-13 17:16)  Authored: General Aspect/Present Illness, History and Physical Exam, Review of System, Past Medical History, Home Medications, EKG , Allergies, Impression/Plan   Last Updated: 07-Dec-13 17:16 by Dalia Heading (MD)

## 2014-05-29 NOTE — Consult Note (Signed)
    General Aspect 64 year old [place] male with history of bypass in 2012 as well as end-stage renal disease, hypertension, hyperlipidemia that is in the ER due to onset of chest pain, nausea, vomiting, diaphoresis while at dialysis.  Patient also states his blood pressure fell to 60 systolic.  He is a poor historian.  He is slightly diaphoretic in the ER.  He has been given  pain medication and his chest pain is almost resolved.  He does have known atrial fibrillation with his Coumadin therapy been stopped several months ago due to noncompliance and maintaining normal sinus rhythm.  Patient has had several office visits since then but has failed to show up for them.[name].  His EKG today did show atrial fibrillation with a heart rate of 120.  There is ST depression that is more evident than previous EKG in V4 through V6.  Currently has a blood pressure 146/74.  Pulse ox 93%, heart rate varying between 100- 117.  Review of records does show that patient was in the ER September 16 with left-sided chest pain at which time a CT of the chest did not show   PE.  He had a BNP of 23,125.  His troponin was slightly elevated at 0.08.  His hemoglobin was 8.7 and platelet count was 112.  He is still pending labs, especially Cardiac enzyme report from this morning.  Dr. Gwen PoundsKowalski was also in to examine the patient and due to the nature of the chest pain, known CAD and EKG changes would like to take him straight to cardiac catheterization. previous echocardiogram from April 22, 2011, showed normal LVEF, LVH, moderate TR, mild to moderate MR.   Physical Exam:   GEN critically ill appearing    HEENT pink conjunctivae    RESP normal resp effort    CARD Irregular rate and rhythm  Tachycardic  Murmur    Murmur Systolic    Systolic Murmur Out flow    ABD denies tenderness    EXTR negative edema    SKIN skin turgor decreased    NEURO cranial nerves intact, motor/sensory function intact    PSYCH A+O to time,  place, person, poor insight, lethargic   Review of Systems:   Subjective/Chief Complaint Chest pain    General: Weakness  chest discomfort    Respiratory: Short of breath    Cardiovascular: Chest pain or discomfort    Gastrointestinal: Nausea  x1    Review of Systems: All other systems were reviewed and found to be negative    Statins: Unknown    Impression 64 year old male with known Atrial fibrillation, CAD, status post bypass, as well as end-stage disease with onset of chest pain, diaphoresis, nausea and vomiting, hypotension during dialysis with ST depression in V4 through V6, more prominent than on previous EKGs, possibly acute coronary syndrome.    Plan 1.Chest pain with EKG change- Patient will  have urgent cardiac catheterization by Dr. Gwen PoundsKowalski within the next one to 2 hours.  Further clinical decisions will be made once the results of this are known.   Patient was seen in collaboration with Dr. Gwen PoundsKowalski, who was also present at the time of evaluation in the ER.   Electronic Signatures: Rudi Cocoarroll, Donna (NP)  (Signed 18-Sep-13 12:29)  Authored: General Aspect/Present Illness, History and Physical Exam, Review of System, Allergies, Impression/Plan   Last Updated: 18-Sep-13 12:29 by Rudi Cocoarroll, Donna (NP)

## 2014-05-29 NOTE — Consult Note (Signed)
PATIENT NAME:  Russell FlowersTERSON, [name] MR#:  [id number] DATE OF BIRTH:  [birth year]  DATE OF CONSULTATION:  11/28/2011  REFERRING PHYSICIAN:   CONSULTING PHYSICIAN:  Marcina MillardAlexander Tiffany Talarico, MD   CARDIOLOGIST: Dr. Gwen PoundsKowalski   CHIEF COMPLAINT: Left-sided chest discomfort.   REASON FOR CONSULTATION: Consultation requested for evaluation of chest discomfort and elevated troponin.   HISTORY OF PRESENT ILLNESS: The patient is a 64 year old gentleman with known history of coronary artery disease and chronic kidney disease on hemodialysis. On 11/27/2011 the patient experienced right-sided discomfort and was sent to [facility] Emergency Room. In the Emergency Room, the patient was noted to be tachycardic and received intravenous Cardizem. The patient was admitted to telemetry where he was noted to have a borderline elevated troponin of 0.24. The patient was recently hospitalized for a COPD flare at which time he also had a borderline elevated troponin.   PAST MEDICAL HISTORY:  1. Status post coronary stent [facility] 10/28/2011 (records unavailable at time of dictation).  2. End-stage renal disease, on hemodialysis.  3. Hypertension.  4. Hyperlipidemia.  5. Chronic obstructive pulmonary disease with recent COPD flare requiring hospitalization.   MEDICATIONS:  1. Aspirin 325 mg daily.  2. Plavix 75 mg daily.  3. Amlodipine 10 mg daily.  4. Hydralazine 50 mg t.i.d.  5. Imdur 30 mg daily.  6. Losartan 100 mg daily.  7. Metoprolol tartrate 25 mg b.i.d.  8. Advair 250/50 one puff b.i.d.  9. Darbepoetin 25 mcg weekly at dialysis.  10. Colace 100 mg b.i.d.  11. Guaifenesin 600 mg b.i.d.  12. Lanthanum carbonate 1000 mg b.i.d.  13. Xopenex nebulizer q.4 p.r.n.  14. Levaquin 250 mg daily for four days.  15. Reglan 5 mg t.i.d.  16. Protonix 40 mg daily. 17. Paricalcitol 6 mcg 3 times weekly. 18. Prednisone taper.  19. Senokot 1 tab b.i.d.  20. Theophylline 100 mg daily.  21. Sevelamer carbonate 800  mg t.i.d. with meals.   SOCIAL HISTORY: The patient [facility]. He continues to smoke half a pack of cigarettes a day. Denies alcohol.   FAMILY HISTORY: No known immediate family history of coronary artery disease or myocardial infarction.   REVIEW OF SYSTEMS: CONSTITUTIONAL: No fever or chills. EYES: No blurry vision. EARS: No hearing loss. RESPIRATORY: Chronic exertional dyspnea due to underlying COPD. CARDIOVASCULAR: Chest discomfort as described above. GI: The patient does have some nausea, vomiting. Denies constipation or diarrhea. GU: No dysuria or hematuria. ENDOCRINE: No polyuria or polydipsia. MUSCULOSKELETAL: No arthralgias or myalgias. NEUROLOGICAL: No focal muscle weakness or numbness. PSYCHOLOGICAL: No depression or anxiety.   PHYSICAL EXAMINATION:   VITAL SIGNS: Blood pressure 137/73, pulse 83, respirations 18, temperature 98.2, pulse oximetry 97%.   HEENT: Pupils equal and reactive to light and accommodation.   NECK: Supple without thyromegaly.   LUNGS: Decreased breath sounds in both bases.   CARDIOVASCULAR: Normal JVP. Normal PMI. Regular rate and rhythm. Normal S1, S2. No appreciable gallop, murmur, or rub.   ABDOMEN: Soft, nontender. Pulses were intact bilaterally.   MUSCULOSKELETAL: Normal muscle tone.   NEUROLOGIC: The patient is alert and oriented x3. Motor and sensory both grossly intact.   IMPRESSION: This is a 64 year old gentleman with known coronary artery disease status post recent coronary stent who presents with atypical chest pain with borderline elevated troponin. The patient has had borderline elevated troponin with recent hospitalizations and likely has demand/supply ischemia certainly in the setting of end-stage renal disease on chronic hemodialysis.  EKG is nondiagnostic. The patient does have elevated lipase which could be consistent with pancreatitis which certainly could cause the patient's current pain  syndrome.   RECOMMENDATIONS:  1. Agree with current therapy.  2. Would defer full dose anticoagulation at this time.  3. Would defer further cardiac diagnostics at this time.   ____________________________ Marcina Millard, MD ap:drc D: 11/28/2011 09:38:39 ET T: 11/28/2011 10:47:57 ET JOB#: [id number]  cc: Marcina Millard, MD, <Dictator> Marcina Millard MD ELECTRONICALLY SIGNED 12/10/2011 14:10

## 2014-05-29 NOTE — Op Note (Signed)
PATIENT NAME:  Russell [name], [name] MR#:  [id number] DATE OF BIRTH:  [birth year]  DATE OF PROCEDURE:  09/04/2011  PREOPERATIVE DIAGNOSIS: Acute appendicitis.   POSTOPERATIVE DIAGNOSIS: Normal appendix, ascites.   SURGEON: Claude MangesWilliam F. Tehillah Cipriani, MD   OPERATION PERFORMED: Laparoscopic appendectomy.  ANESTHESIA: General.   PROCEDURE IN DETAIL: The patient was placed supine on the operating room table and prepped and draped in the usual sterile fashion. A one-inch incision was made in the supraumbilical midline and this was carried down through the linea alba sharply and the peritoneum was entered carefully. Horizontal mattress sutures of 0 Vicryl were placed and a Hassan cannula was introduced and a 15 mmHg CO2 pneumoperitoneum was created. The patient had 1 or 2 very tiny flimsy adhesions and the small intestine appeared mildly dilated and had a glistening surface. There was about 50 mL of fairly clear green tinged ascites in the pelvis. This was not cloudy and was certainly not pus. This was suctioned out. The appendix was noted to be quite short and normal in appearance. It was not injected and there was no fibrinous exudate and there was no evidence of acute appendicitis. The mesoappendix was taken down with the Harmonic scalpel and the appendectomy was performed at the junction of the appendix and the cecum with an EndoGIA stapling device and the appendix was placed in an EndoCatch bag and extracted from the abdomen via the supraumbilical port. The patient was placed in steep Trendelenburg and the rectosigmoid was inspected and although there was significant diverticulosis there was no evidence of diverticulitis. The small intestine did not appear to have any creeping fat or evidence of enteritis and the liver appeared normal. The gallbladder appeared normal. The peritoneum was then desufflated and decannulated and the linea alba was closed with a running 0 PDS suture and the previously placed Vicryls were  tied one to another and all three skin sites were closed with subcuticular 5-0 Monocryl and suture strips. The patient tolerated the procedure well. There were no complications. ____________________________ Claude MangesWilliam F. Yasenia Reedy, MD wfm:drc D: 09/04/2011 22:54:59 ET T: 09/05/2011 10:24:04 ET JOB#: [id number] Claude MangesWILLIAM F Jaleigha Deane MD ELECTRONICALLY SIGNED 09/07/2011 22:22

## 2014-05-29 NOTE — Consult Note (Signed)
General Aspect 64 yo male with history of multiple medical problems including cad s/p cabg with pci of the rca in 9/13, history of end stage renal disease on chronic hemodialysis, history of continued tobacco abuse, history of recent admissions for pancreatitis and respiratory failure who now presents to the er after noting complaints of dizziness, chest pain and abdominal pain after dialysis. In the er he ws ntoed to be in his chornic afib with rvr and t wave inversions on his ekg. He also had a mild troponin elevation. There were no st elevations noted. He was not felt ot be having an acute mi. He complaned of abdominal discomfort. He has been compliant with hsi asa and plavix per his report.   Physical Exam:   GEN disheveled    HEENT PERRL    NECK supple    RESP no use of accessory muscles    CARD Irregular rate and rhythm  Murmur    Murmur Systolic    Systolic Murmur axilla    ABD positive tenderness  hypoactive BS  no Abdominal Bruits    LYMPH negative neck    EXTR negative cyanosis/clubbing, positive edema    SKIN normal to palpation    NEURO cranial nerves intact, motor/sensory function intact    PSYCH poor insight   Review of Systems:   Subjective/Chief Complaint chest and abdominal pain    General: Fatigue  Weakness    Skin: No Complaints    ENT: No Complaints    Eyes: No Complaints    Neck: No Complaints    Respiratory: Short of breath    Cardiovascular: Chest pain or discomfort  Tightness    Gastrointestinal: abdominal pain    Genitourinary: No Complaints    Vascular: No Complaints    Musculoskeletal: No Complaints    Neurologic: No Complaints    Hematologic: No Complaints    Endocrine: No Complaints    Psychiatric: No Complaints    Review of Systems: All other systems were reviewed and found to be negative    Medications/Allergies Reviewed Medications/Allergies reviewed     CHF:    Atrial Fibrillation:    Bronchitis:    MI -  Myocardial Infarct:    pneumonia:    htn:    dialysis:    Stent placement 11/05/2011:    Appendectomy:    CABG (Coronary Artery Bypass Graft):   Home Medications: Medication Instructions Status  metoprolol tartrate 25 mg oral tablet 1 tab(s) orally 2 times a day Active  Claritin 10 mg oral tablet 1 tab(s) orally once a day Active  Nephro-Vite oral tablet 1 tab(s) orally once a day Active  aspirin 81 mg oral tablet 1 tab(s) orally once a day Active  docusate sodium 100 mg oral capsule 1 cap(s) orally once a day Active  Senna Plus 50 mg-8.6 mg oral tablet 2 tab(s) orally once a day Active  clopidogrel 75 mg oral tablet 1 tab(s) orally once a day Active  amlodipine 10 mg oral tablet 1 tab(s) orally once a day Active  losartan 100 mg oral tablet 1 tab(s) orally once a day Active   EKG:   Interpretation afib with rvr    Statins: Unknown    Impression 64 yo male with history of end stage renal disease on chronic hemodialysis, history of cad s/p cabg in the past with pci of his rca in 9/13, history of cardiomyopathy, history of respiratory failure and pancreatitis who presented to the er with complaints of abdominal discomfort  with abdominal ct showing no acute prcess. He also had some chest pain and ekg showed afib with rvr with no injury currentl He is in chornic afib and has been taken off chronic anticoagulation in the past due to anemia and bleeding. He has mild troponin elevation which is likely secondary to incrased myocardiac demand and renal failure. His cxr shows mild volume overload. Does not apear to be having an acute cornary event. Abdominal discomfrot is of unclear etiology . He is hemodynaically stab le at present.    Plan 1. Continue to control afib rate with cardizem and add beta blockers as rate and blood pressure allow.  2. Continue with hemodialysis 3. Defer chronic anticagulation at present 4. Agree with gi evaluation to work up abdominal discomfort. Does not  appear to have acute pancreatitis at present 5. Rule out for mi and continue dual antiplatelet therapy due to recent coronary stent unless significant bleeding occurs.  6. Discontiue tobacco 7.   Electronic Signatures: Dalia Heading (MD)  (Signed [phone] 22:49)  Authored: General Aspect/Present Illness, History and Physical Exam, Review of System, Past Medical History, Home Medications, EKG , Allergies, Impression/Plan   Last Updated: 18-Nov-13 22:49 by Dalia Heading (MD)

## 2014-05-29 NOTE — Discharge Summary (Signed)
PATIENT NAME:  Russell FlowersTERSON, [name] MR#:  [id number] DATE OF BIRTH:  09-23-1950  DATE OF ADMISSION:  11/14/2011 DATE OF DISCHARGE:  11/24/2011  ADMITTING PHYSICIAN: Dr. Duncan Dulleresa Tullo  DISCHARGING PHYSICIAN: Dr. Enid Baasadhika Neshawn Aird   PRIMARY CARE PHYSICIAN: Not known   CONSULTATIONS IN THE HOSPITAL:  1. Nephrology consultation by Dr. Mady HaagensenMunsoor Lateef.  2. Cardiology consultation with Dr. Juliann Paresallwood, Dr. Gwen PoundsKowalski and Dr. Kirke CorinArida.   3. Pulmonary critical care consultation with Dr. Belia HemanKasa and also Dr. Freda MunroSaadat Khan.    DISCHARGE DIAGNOSES:  1. Acute respiratory failure.  2. Chronic obstructive pulmonary disease exacerbation.  3. Pulmonary edema.  4. Acute on chronic diastolic congestive heart failure.  5. Hypertension.  6. End-stage renal disease on Monday, Wednesday, Friday hemodialysis.  7. Anemia of chronic disease.  8. Coronary artery disease status post bypass graft surgery and recent stent placement at [facility] in 10/2011.  9. Tobacco use disorder.   DISCHARGE HOME MEDICATIONS:  1. Protonix 40 mg p.o. daily.  2. Advair 250/50, 1 puff b.i.d.   3. Nephro-Vite 1 tablet p.o. daily.  4. Colace 100 mg p.o. daily.  5. Aspirin 81 mg p.o. daily.  6. Senna 8.6 mg 2 tablets once a day.  7. Claritin 10 mg p.o. daily.  8. Norvasc 10 mg p.o. daily.  9. Hydralazine 100 mg tablet p.o. 4 times a day.  10. Losartan 100 mg p.o. daily.  11. Vitamin D3 2000 international units daily.  12. Renagel 400 mg p.o. t.i.d.  13. ProAir inhaler 2 puffs 4 times a day as needed.  14. Fosrenol 1000 mg 2 tablets 3 times a day.  15. Imdur 30 mg p.o. daily.  16. Plavix 75 mg p.o. daily.  17. Metoprolol 25 mg p.o. b.i.d.  18. Theophylline 100 mg p.o. daily.  19. Guaifenesin 600 mg p.o. b.i.d.  20. Prednisone taper 60 mg every day and taper off by 5mg  every day  21. Levaquin 250 mg p.o. daily for four more days.   DISCHARGE DIET: Renal diet.   DISCHARGE ACTIVITY: As tolerated.   OXYGEN: 1 liter and wean as  tolerated.   FOLLOWUP INSTRUCTIONS:  1. Primary care physician follow-up in 1 to 2 weeks.  2. Cardiology follow up with Dr. Kirke CorinArida in two weeks.  3. Dialysis as per schedule on 11/25/2011. Patient is on Monday, Wednesday, Friday dialysis schedule. 4. Physical therapy.   LABORATORY, DIAGNOSTIC AND RADIOLOGICAL DATA: WBC 6.8, hemoglobin 12.1, hematocrit 35.9, platelet count 141, phosphorus 7.1 after which patient had dialysis. Chest x-ray showing mild interstitial edema similar to prior. Troponins have remained borderline negative while in the hospital.   Sodium 139, potassium 4.4, chloride 99, bicarbonate 27, BUN 48, creatinine 5.35, glucose 99, calcium 8.4.   BRIEF HOSPITAL COURSE: Russell [name] is a [age] year old African American male with past medical history significant for coronary artery disease status post bypass surgery and recent stent at [facility] in R[facility] in 10/2011, dialysis on Monday, Wednesday, Friday dialysis schedule, hypertension was brought in secondary to shortness of breath. Was found to be in pulmonary edema and was admitted for acute respiratory failure.  1. Acute respiratory failure secondary to pulmonary edema and also chronic obstructive pulmonary disease exacerbation. He was dialyzed immediately when he was admitted to the hospital and is put back on his Monday, Wednesday, Friday dialysis schedule. Does not make any urine so not on any diuretics. His last dialysis was today and then he was also dialyzed yesterday. His last chest x-ray shows only  mild pulmonary interstitial edema. He also had severe chronic obstructive pulmonary disease exacerbation for which he was treated with IV Solu-Medrol while he was in the hospital and was on Rocephin and azithromycin for bronchitis. His wheezing has improved. He still has some baseline wheezing and is being discharged on prednisone taper and also Levaquin for four more days to finish off the course. His initial blood cultures on  admission were negative and chest x-ray did not show any infiltrate. He is being weaned down to 1 liter oxygen that can be weaned as an outpatient slowly.  2. Acute on chronic diastolic congestive heart failure, went into pulmonary edema secondary to as mentioned above, was dialyzed and breathing improved. His ejection fraction was 55%.  3. Coronary artery disease status post bypass graft surgery. Recent admission in 10/2011, was transferred to [facility] for complicated RCA stenosis and had a stent placement at the time. On readmission to this hospital now his Plavix was held as it was not listed on his home medications and after checking with Dr. Kirke Corin who confirmed that patient should be on Plavix for at least one year because he had a drug-eluting stent placed. He was loaded with 300 mg of Plavix and restarted on 75 mg daily of Plavix. He did have a mild bump in troponin on admission which is probably demand ischemia secondary to hypoxia and chronic obstructive pulmonary disease exacerbation.  4. End-stage renal disease. As mentioned above he was dialyzed immediately for pulmonary edema on admission and was put back on his Monday, Wednesday, Friday dialysis schedule. His last dialysis was today, 11/24/2011.  5. Tobacco use disorder. He was strongly counseled against smoking, especially after his recent MI and stent placement. He was still smoking at home. He was on nicotine patch while in the hospital.  6. Hypertension. He is on Imdur, Norvasc, hydralazine, metoprolol and Cozaar. 7. Physical therapy worked with the patient and recommended short term rehab and patient will be going to [facility].   DISCHARGE CONDITION: Stable.   DISCHARGE DISPOSITION: Short-term rehab.      TIME SPENT ON DISCHARGE: 45 minutes.    ____________________________ Enid Baas, MD rk:cms D: 11/24/2011 13:53:00 ET T: 11/24/2011 14:14:22 ET JOB#: [id number]  cc: Enid Baas, MD, <Dictator> Muhammad A.  Kirke Corin, MD Enid Baas MD ELECTRONICALLY SIGNED 12/01/2011 14:12

## 2014-05-29 NOTE — H&P (Signed)
PATIENT NAME:  Russell, [name] MR#:  [id number] DATE OF BIRTH:  1950/10/17  DATE OF ADMISSION:  01/11/2012  REASON FOR ADMISSION: Chest pain and atrial fibrillation with RVR.   REFERRING PHYSICIAN: Dr. Si Raider PRIMARY CARE PHYSICIAN: Nonlocal  NEPHROLOGIST: Dr. Cherylann Ratel  CARDIOLOGIST: Dr. Gwen Pounds  HISTORY OF PRESENT ILLNESS: Russell [name] is a very nice 64 year old gentleman who was recently admitted here on 12/28/2011 with nausea, abdominal pain, diaphoresis. He was diagnosed with a non-ST elevation MI and atrial fibrillation with RVR. He was then discharged on 11/24 and today after being in his normal state of health he went to dialysis and complained with pressure-like pain located in the mid sternal area. At the moment the pain was nonradiating. He states that he tried to tolerate it during dialysis but they had to stop due to the fact that his heart rate spiked up and he is getting short of breath with tightness of his chest feeling like something was sitting on top of his chest. The patient was seen to be on a heart rate of 148, his respirations were around 26 and is starting to get diaphoretic. The patient was brought to the ER where he was evaluated by Dr. Brien Mates who started him on Cardizem boluses that did not slow down his heart rate. The patient then received one bolus of amiodarone that started to calm down on his heart rate and as his heart rate slowed down went down to 80s to 100s. As talking to the patient and decided where he needs to go his heart rate started to spike up above 120 again. When he was discharged his troponins were around 3, right now they are 0.8 but there is no results from labs from 11/19 till 12/02 so we don't know if they normalized or if this is residual. The patient has significant chest pain that did not relieve all the way until he got several doses of medications including fentanyl. We are going to start him on heparin drip. The patient at this moment is  lethargic after his doses of medications and he wakes up enough to answer several questions but he falls asleep right away.   REVIEW OF SYSTEMS: Unable to obtain a full review of systems due to patient lethargy after pain medications were given.   PAST MEDICAL HISTORY:  1. Non-ST elevation myocardial infarction recently on 12/28/2011.  2. Acute pancreatitis on 11/27/2011.  3. Acute respiratory failure also admitted on 11/14/2011.   4. Coronary artery disease, status post CABG x3 in 2011.  5. Status post a stent in right coronary artery on 10/2011 at [facility]. 6. Chronic obstructive pulmonary disease.  7. Ongoing smoking.  8. Chronic diastolic congestive heart failure.  9. Hypertension.  10. Hyperlipidemia.  11. End-stage renal disease with hemodialysis on Monday, Wednesday, Friday.  12. Chronic nausea and vomiting.   PAST SURGICAL HISTORY:  1. CABG x3 2011 and stent 10/2011 in right coronary artery at [facility].  2. Appendectomy.  3. Shoulder surgery.  4. Fistula placement x2.   ALLERGIES: Patient does not have any significant allergies although he is intolerant to statins.   SOCIAL HISTORY: Patient lives in [place]. He continues to smoke about 1/2 pack a day. He has been smoking for all his life. He is actually cutting down a little bit. He denies any tobacco or drug abuse. He does not have a primary care physician.   FAMILY HISTORY: Patient has a mother with hypertension and chronic kidney failure.  He denies any significant coronary artery disease in the family.   MEDICATIONS:  1. Acetaminophen with oxycodone p.r.n. pain.  2. Advair Diskus 250/50, 1 puff twice daily. 3. Amlodipine 10 mg once a day.  4. Aspirin 81 mg once a day. 5. Claritin 10 mg once a day. 6. Plavix 75 mg once a day. 7. Diltiazem CD 120 mg once daily.  8. Docusate 100 mg once daily.  9. Isosorbide mononitrate 30 mg extended release once daily.  10. Losartan 100 mg once daily.  11. Metoprolol 25 mg  twice daily.  12. Nephro-Vite once daily.  13. Nitroglycerin p.r.n. chest pain.  14. Senna p.r.n. constipation. 15. Theophylline 100 mg extended-release capsule once daily.   PHYSICAL EXAMINATION:  VITAL SIGNS: Pulse 148, temperature 98.2, systolic blood pressure 114, diastolic blood pressure 92, blood pressure came down to 93/62 after diltiazem and amiodarone. Oxygen saturation 98% on room air.   GENERAL: Patient is lethargic after several doses of pain medications including fentanyl but he is overall stable and comfortable.    HEENT: Pupils are equal and reactive. Extraocular movements are intact. Mucosa are moist.   NECK: Supple. No JVD. No thyromegaly. No adenopathy. No carotid bruits. No masses.   CARDIOVASCULAR: Irregularly irregular, tachycardic. Systolic ejection murmur 2/6, no rubs, or gallop. No tenderness to palpation of the chest wall reproducible at this moment. No thrills. No displacement of PMI.   LUNGS: Patient exhibits multiple crackles bilateral lung fields. There is good air entrance on both fields and no use of accessory muscles at this moment. No dullness to percussion.   ABDOMEN: Soft, nontender, nondistended. No hepatosplenomegaly. No masses. Bowel sounds are positive.   GENITAL: There are no lesions.   EXTREMITIES: Positive edema +2 of pretibial areas. Pulses +2. Capillary refill less than 3 seconds.   SKIN: Without any significant rashes or petechiae.   LYMPHATICS: Negative for lymphadenopathy in supraclavicular or neck areas.   PSYCH: Mood is normal without any signs of depression. At this moment he is lethargic, not anxious.   NEUROLOGICAL: Cranial nerves II through XII are intact. Strength is 5/5 in four extremities.   MUSCULOSKELETAL: No significant joint effusions.   LABORATORY, DIAGNOSTIC AND RADIOLOGICAL DATA: Creatinine 4.1, BUN 20, glucose 74, sodium 138, potassium 3.5, calcium 8.8. LFTs within normal limits. Troponin 0.08. As mentioned above, his  troponins before where 0.01 on the 18th and went up to 3.5. When patient was discharged I do not have any following troponins so do not know what they were in between the 19th to 12/02. Right now is 0.08, with an elevation of the total CK and MB as well. Hemoglobin 9.6, platelets 153. Chest x-ray: Bilateral diffuse interstitial thickening likely representing interstitial edema versus interstitial pneumonitis. EKG: Atrial fibrillation with RVR with significant ST depression on the lateral leads.   ASSESSMENT AND PLAN:  1. Chest pain. Patient is a 64 year old gentleman with very well known history of coronary artery disease. He has a CABG. The patient has a coronary artery bypass graft and stent recently placed about middle of September. He also was admitted here within the last couple of weeks with atrial fibrillation with RVR and a non-ST elevation myocardial infarction. The patient says he has been taking all his medications including Plavix, aspirin and his blood pressure medications and rate control medications but today developed new onset chest pain that didn't relieve until he got several doses of narcotic medications and nitroglycerin and his heart rate continued to be elevated in the 140s  despite the fact that he got boluses of Cardizem and amiodarone. Right now his heart rate is better but after the bolus started to spike up down to the 120s for what we decided to keep him on Cardizem drip. We need to have his heart rate well controlled to avoid any worsening of this acute coronary syndrome.  2. Acute coronary syndrome. Patient to continue aspirin, Plavix, beta blocker. Rate control with amiodarone and p.o. diltiazem and also anticoagulation with heparin. Will follow up enzymes and cardiology will be consulted to see the patient, Dr. Gwen Pounds. Patient to go to the Critical Care Unit at this moment. Likely no intervention required at this moment.  3. Atrial fibrillation with RVR. Patient continues to  have significant elevation in his heart rate despite the fact that he received diltiazem and a bolus of amiodarone for what we are going to continue amiodarone bolus and close observation in the Critical Care Unit. At this moment if he is in acute coronary syndrome we need to have his heart rate well controlled for what I need the drip to continue and try to keep his heart rate definitely below 90.   4. Congestive heart failure. Patient does have history of chronic diastolic dysfunction. His last echocardiogram was done in September and it showed an ejection fraction of above 55%, right ventricular systolic function was normal, dilation of the left atrium and elevated right ventricular pressures of 50 to 60 consistent with pulmonary hypertension. No need to repeat the echo at this moment but he is also a little bit decompensated. Since he has end-stage renal disease and does not make urine he will need to be dialyzed if any worsening. At this moment I think his pulmonary edema is minimal and it might be more on the chronic ongoing congestive heart failure basis for what will just wait for dialysis and if he gets a little bit more congested we will have to do acute dialysis with removal of fluids. At this moment I think he will be okay. We are going to consult nephrology.  5. Hypertension. His blood pressure has been a little bit low after the boluses for what I am going to hold his ACE inhibitor and Norvasc. I am going to continue his beta blocker and watch his blood pressure on the unit.  6. End-stage renal disease. Hemodialyses Monday, Wednesday, Friday.  7. Chronic obstructive pulmonary disease. Patient needs respiratory treatment. We are going to write up for nebs and pulmonary toilet. At this moment there are no signs of any acute exacerbation of his chronic obstructive pulmonary disease.  8. Dyslipidemia. Patient cannot take statins.  9. Ongoing tobacco abuse. Tobacco counseling given. Patient is a  little bit lethargic to really understand for what we only spent three minutes trying to talk on and off but patient not able to get a good grasp of it.   10. Deep vein thrombosis prophylaxis. Heparin protocol.  11. GI prophylaxis. Protonix.   TIME SPENT: I spent about 55 minutes with this patient today.   ____________________________ Felipa Furnace, MD rsg:cms D: 01/11/2012 14:27:35 ET T: 01/11/2012 15:52:41 ET JOB#: [id number]  cc: Felipa Furnace, MD, <Dictator>  Jacqualyn Sedgwick Juanda Chance MD ELECTRONICALLY SIGNED 01/12/2012 14:03

## 2014-05-29 NOTE — Consult Note (Signed)
Chief Complaint:   Subjective/Chief Complaint Still c/o abdominal pain but appears comfortable. No vomiting and tolerating diet well. Lactic acid as well as CT with contrast is normal.   VITAL SIGNS/ANCILLARY NOTES: **Vital Signs.:   20-Nov-13 16:15   Vital Signs Type Routine   Temperature Temperature (F) 97.6   Celsius 36.4   Temperature Source oral   Pulse Pulse 84   Respirations Respirations 19   Systolic BP Systolic BP 204   Diastolic BP (mmHg) Diastolic BP (mmHg) 90   Mean BP 128   Pulse Ox % Pulse Ox % 97   Pulse Ox Activity Level  At rest   Oxygen Delivery 2L   Brief Assessment:   Additional Physical Exam Abdomen is soft and benign. Significant tenderness noted yesterday is no longer present.   Lab Results: Routine Chem:  20-Nov-13 08:30    Potassium, Serum 4.3 (Result(s) reported on 30 Dec 2011 at 11:17AM.)   Phosphorus, Serum  7.2 (Result(s) reported on 30 Dec 2011 at 11:09AM.)   Radiology Results: CT:    20-Nov-13 12:50, CT Abdomen and Pelvis With Contrast   CT Abdomen and Pelvis With Contrast    REASON FOR EXAM:    (1) abdominal pain; (2) mesenteric ischemia  COMMENTS:       PROCEDURE: CT  - CT ABDOMEN / PELVIS  W  - Dec 30 2011 12:50PM     RESULT: Difficult pain.    Comparison Study: Prior CT of 12/28/2011.    Findings: Standard CT obtainedunder cc of Isovue-370. Bilateral pleural   effusions and atelectasis. Liver spleen pancreas normal. 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   Gallstones are noted. Right renal transplant is noted. Celiac and SMA are   patent. IMA is patent. Portal venous system and mesenteric veins patent.   Mild small bowel distention is noted. No pneumatosis no free air.  IMPRESSION:  No evidence of mesenteric ischemia.        Verified By: Gwynn BurlyHOMAS E. REGISTER, M.D., MD   Assessment/Plan:  Assessment/Plan:   Assessment Abdominal pain. Clinically better. CT and other work up  negative. Tolerating diet well. Cholelithiasis. No evidence of cholecystitis. NSTEMI. A. fibb with RVR. Anemia, chronic and stable.    Plan Continue PPI. No further GI recommendations at this time. Case discussed with Dr. Juliann PulseLundquist. Follow up with me as OP if needed. Will sign off. plerase reconsult if needed. Thanks.   Electronic Signatures: Lurline DelIftikhar, Russell [name] (MD)  (Signed [phone]-Nov-13 17:32)  Authored: Chief Complaint, VITAL SIGNS/ANCILLARY NOTES, Brief Assessment, Lab Results, Radiology Results, Assessment/Plan   Last Updated: 20-Nov-13 17:32 by Lurline DelIftikhar, Russell [name] (MD)

## 2014-05-29 NOTE — Consult Note (Signed)
PATIENT NAME:  Russell [name], Russell [name] MR#:  [id number] DATE OF BIRTH:  06-13-1950  DATE OF CONSULTATION:  09/05/2011  REFERRING PHYSICIAN:  Natale Lay, MD and [facility] CONSULTING PHYSICIAN:  Johnwesley Lederman D. Lakelynn Severtson, MD  INDICATION: Preop for surgery, bradycardia, coronary disease.   HISTORY OF PRESENT ILLNESS: The patient is a 64 year old male presenting with abdominal pain, worsening right lower quadrant discomfort, right-sided discomfort over the last few days. He had had some nausea, vomiting, emesis. He has a history of end-stage renal disease. He presented with non ST-elevation myocardial infarction in May of 2013. He was treated medically. He developed chest pain in the left shoulder while on dialysis, worsening right lower quadrant.  Workup in the Emergency Room with CT scan was concerning for acute appendicitis. He had a previous history of right-sided kidney transplant in 2010.  He has history of noncompliance, coronary artery disease, atrial fibrillation, pneumonia, chronic obstructive pulmonary disease. The patient had worsening symptoms so he was admitted for evaluation. Surgery suggested possible appendicitis, and he was preop for surgery, and they wanted Cardiology involvement.   REVIEW OF SYSTEMS: No blackout spells or syncope. He has had nausea and vomiting. No real fever, no chills, no sweats. Denies weight loss, weight gain. No hemoptysis or hematemesis. He denies bright red blood per rectum. He denies vision change or hearing change. He denies sputum production or cough.   PAST MEDICAL HISTORY:  1. Pneumonia.  2. Hypertension.  3. End-stage renal disease, dialysis. 4. Coronary artery disease. 5. Non-Q-wave myocardial infarction.   ALLERGIES: Statins.   MEDICATIONS:  1. Advair Diskus 250/50 b.i.d.   2. Amitriptyline 100 mg a day.  3. Nephro-Vite once a day.  4. Colace 100 mg once a day.  5. Neurontin 300 mg a day.  6. Amiodarone 200 mg a day.  7. Aspirin 81 mg a day.  8. Combivent  every 4 hr p.r.n.  9. Nexium 40 mg a day.  10. Hydralazine 50 mg t.i.d.  11. Metoprolol 25 mg b.i.d.   12. Norco 5/325 every 6 hours p.r.n.  13. Senna 8.6.  14. Coumadin 6 mg a day.  15. Claritin 10 mg a day.   FAMILY HISTORY: Noncontributory. He lives at home.   SOCIAL HISTORY: He denies recent smoking or alcohol consumption.   PHYSICAL EXAMINATION:  VITAL SIGNS: Blood pressure 130/70, pulse 60, respiratory rate 16, afebrile.   HEENT: Normocephalic, atraumatic. Pupils are equal and reactive to light.   NECK: Supple. No significant jugular venous distention, bruits, or adenopathy.   LUNGS: Bilateral rhonchi, bilateral wheezing, coarse breath sounds. No rales. Adequate air movement.   HEART: Regular, bradycardic, systolic ejection murmur at left sternal border. Positive S4. PMI is nondisplaced.   ABDOMEN: Exam is benign.   EXTREMITIES: Exam within normal limits except for a shunt for dialysis.   NEUROLOGICAL: Exam grossly intact.   ABDOMEN: Soft, tender diffusely. Positive bowel sounds. Mild guarding, mild rebound.   LABORATORY, DIAGNOSTIC AND RADIOLOGICAL DATA:  LFTs unremarkable. Glucose 86, BUN 22, creatinine elevated at 4.65, sodium 139, potassium 3.5. CPK elevated at 271. MB 6.4. Troponin was reportedly negative. PT 13. White count 3.0, hemoglobin 10.4, hematocrit 30.7, platelet count 94.0   ASSESSMENT:  1. Possible appendicitis, preop for surgery. 2. Coronary artery disease. 3. History of myocardial infarction. 4. Hypertension. 5. End-stage renal disease. 6. Chronic obstructive pulmonary disease. 7. Bronchospasm.   PLAN: Since the patient appears to have an acute abdomen preop for surgery, he is at least a moderate risk for  surgery at this point; but because he has an acute abdomen it appears surgery should not be delayed for any further cardiac work-up. Would consider beta blockers pre- and postoperative if his pulmonary status will tolerate it. We will watch for  bradycardia. Watch for hypotension and dehydration. Will consider to try to arrange dialysis postoperatively with the nephrologist. Continue blood pressure control as necessary. Antibiotics preop. I suspect that he will be able to tolerate surgery without much difficulty, and I will follow him postoperatively as necessary.  ____________________________ Bobbie Stackwayne D. Juliann Paresallwood, MD ddc:cbb D: 09/06/2011 13:02:00 ET T: 09/06/2011 13:20:56 ET JOB#: [id number]  cc: Heaven Wandell D. Juliann Paresallwood, MD, <Dictator> Alwyn PeaWAYNE D Roddie Riegler MD ELECTRONICALLY SIGNED 09/09/2011 23:12

## 2014-05-29 NOTE — Consult Note (Signed)
PATIENT NAME:  Russell [name], Russell [name] MR#:  [id number] DATE OF BIRTH:  1951-02-06  DATE OF CONSULTATION:  12/30/2011  REFERRING PHYSICIAN:   CONSULTING PHYSICIAN:  Lurline Del, MD  REASON FOR CONSULTATION: Nausea, abdominal pain.   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  The patient was admitted two days ago after he developed chest pain as well as some abdominal pain after dialysis. He became diaphoretic, felt lightheaded and dizzy. He came to the Emergency Room where he was found to be in atrial fibrillation with rapid ventricular rate. He was started on Cardizem drip. The patient's troponin was elevated. EKG was somewhat abnormal. The patient's hemoglobin was reported to be 9, which was lower than his hemoglobin in October of 12.9, although in the past his hemoglobin has been as low as 8.1. The patient was also complaining of abdominal pain and has vomited a few times and therefore gastroenterology was consulted. The patient was evaluated on admission and has been seen every day since then. Initially the patient had some right upper quadrant abdominal pain and tenderness. Yesterday he was complaining of diffuse abdominal pain and severe diffuse abdominal tenderness was noted. Today his abdominal pain is better. He is tolerating diet and no abdominal tenderness was noted on today's examination.   PAST MEDICAL HISTORY: As above.   ALLERGIES: Statins.   HOME MEDICATIONS:  Advair, Imdur, Lopressor, theophylline, amlodipine, aspirin, Plavix, hydralazine, losartan, albuterol, Atrovent, Senna, Protonix, and Reglan.   SOCIAL HISTORY: Smokes about 1/2 pack a day.   FAMILY HISTORY: Mother had hypertension and renal failure.   REVIEW OF SYSTEMS: Positive for nausea, vomiting, diffuse abdominal  pain, weakness, dizziness, lightheadedness, and chest pain as mentioned above.    PHYSICAL EXAMINATION:  GENERAL: Chronically ill appearing male. He does not appear to be in any acute distress.  He has been afebrile.   VITAL SIGNS: Heart rate is in the 70s and 80s, respirations about 18 to 20, blood pressure 150/70.   HEENT: Otherwise unremarkable. He is not jaundiced. He does appear to be mildly anemic.   NECK: Neck veins are flat.   LUNGS: Grossly clear to auscultation bilaterally with fair air entry.   CARDIOVASCULAR: Rapid heart rate, irregular rate and rhythm.   ABDOMEN: Examination initially showed significant right upper quadrant tenderness. Bowel sounds were sluggish. No hepatosplenomegaly or ascites were noted. Yesterday diffuse abdominal tenderness was noted, but today his abdominal examination is benign without significant abdominal tenderness and normal bowel sounds.   NEUROLOGIC: Examination appears to be unremarkable.   LABORATORY, DIAGNOSTIC, AND RADIOLOGICAL DATA: White cell count was 4.2 on admission. Hemoglobin was 9 and remain stable. Troponin is as high as 3.5. Creatinine 5.24. Troponin was 0.1 on admission. Liver enzymes are normal. Serum amylase and lipase are unremarkable.   CT scan of the abdomen and pelvis showed no acute abnormalities. Cardiomegaly was noted. Some gallstones and sigmoid diverticulosis, otherwise unremarkable. Serum lactic acid is normal. Due to the patient's significant abdominal pain a CT with IV contrast was obtained which was done today and is unremarkable.   ASSESSMENT AND PLAN:  1. The patient with diffuse abdominal pain, etiology is not clear. Mesenteric ischemia is not the case and work-up for acute pancreatitis, mesenteric ischemia, and cholecystitis, etc. has been unremarkable. The patient has gallstones, although his abdominal pain does not appear to be related to  gallstones. His abdominal pain is much better.  2. Questionable  gastrointestinal bleed. The patient has no signs of active GI bleeding. His hemoglobin is lower than what it was in October, but in the past his hemoglobin has been as low as 8.1. Again, as mentioned above, there are no signs of active gastrointestinal bleeding.  3. The patient also has evidence of non-STEMI as evidenced by rising troponin. The patient came in with atrial fibrillation with rapid ventricular rate, which is now controlled with Cardizem. Surgical consultation was obtained and no surgery is planned. I would recommend continuing him on PPI for symptomatic treatment of his abdominal pain. I agree there is no need for cholecystectomy at this point, especially because of the fact that the patient probably has had a myocardial infarction.  4. As the patient is clinically improving I would recommend advancing his diet and outpatient GI followup for further evaluation as needed. No further recommendations at this point. We will sign off.     ____________________________ Lurline DelShaukat Navdeep Fessenden, MD si:bjt D: 12/30/2011 17:40:18 ET T: 12/31/2011 08:41:34 ET JOB#: [id number]  cc: Lurline DelShaukat Kaynen Minner, MD, <Dictator> Lurline DelSHAUKAT Mileydi Milsap MD ELECTRONICALLY SIGNED 01/08/2012 20:03

## 2014-05-29 NOTE — Consult Note (Signed)
PATIENT NAME:  Russell [name], Russell [name] MR#:  [id number] DATE OF BIRTH:  11/04/1950  DATE OF CONSULTATION:  10/26/2011  REFERRING PHYSICIAN:  Hilda Lias, MD CONSULTING PHYSICIAN:  Lamar Blinks, MD  PRIMARY CARE PHYSICIAN: Molli Barrows, MD  REASON FOR CONSULTATION: Chest pain and shortness of breath with known cardiovascular disease.   CHIEF COMPLAINT: "I've had chest pain".  HISTORY OF PRESENT ILLNESS: This is a 64 year old male with known coronary artery disease status post coronary artery bypass graft on 07/01/2010 with hypertension, hyperlipidemia, and end-stage renal disease on dialysis with valvular heart disease as well. The patient has been on the appropriate medications for management of these issues and has been relatively well controlled with hypertension and hyperlipidemia. The patient has had dialysis today for which he has had some irregularity of heartbeat and left-sided chest discomfort more axillary in nature with some shortness of breath. He has a cough and some rhonchi but no evidence of fever or elevated white blood cell count. Chest x-ray suggests inflammatory changes as well as possible pneumonia, but CT scan shows no evidence of significant changes. The patient has an EKG showing normal sinus rhythm with left ventricular hypertrophy and nonspecific ST changes unchanged from before. Troponin and CK-MB were within normal limits. The patient has had partial dialysis today and will need further dialysis later. He has known valvular heart disease which has been stable in the past. Otherwise, the patient has had appropriate medications listed below for his hypertension as well. He has had some atrial fibrillation in the past for which he has maintained normal sinus rhythm in the past and currently.   REVIEW OF SYSTEMS: The remainder of his review of systems is negative for vision change, ringing in the ears, and hearing loss. It is positive for cough and congestion. It is negative for  heartburn, nausea, vomiting, diarrhea, bloody stools, stomach pain, extremity pain, leg weakness, cramping of the buttocks, known blood clots, headaches, blackouts, dizzy spells, nosebleeds, congestion, frequent urination, urination at night, muscle weakness, numbness, anxiety, depression, skin lesions, or skin rashes.   PAST MEDICAL HISTORY:  1. Endstage renal disease.  2. Hypertension.  3. Hyperlipidemia.  4. Coronary artery bypass.  5. Valvular heart disease.   FAMILY HISTORY: Mother had kidney disease and death at 41. Brother had cerebrovascular accidents.  SOCIAL HISTORY: Currently smokes tobacco. Denies alcohol use.   ALLERGIES: No known drug allergies.   CURRENT MEDICATIONS: As listed.   PHYSICAL EXAMINATION:   VITAL SIGNS: Blood pressure 176/68 bilaterally and heart rate is 62 upright, reclining, and regular.   GENERAL: He is a well appearing male in no acute distress.   HEENT: No icterus, thyromegaly, ulcers, hemorrhage, or xanthelasma.   HEART: Regular rate and rhythm. Normal S1 and S2 with a 2/6 apical murmur consistent with mitral regurgitation. Point of maximal impulse is diffuse. Carotid upstroke is normal without bruit. Jugular venous pressure is normal.   LUNGS: Lungs have left-sided crackles with rhonchi in the bronchus area and some expiratory wheezes.   ABDOMEN: Soft and nontender without hepatosplenomegaly or masses. Abdominal aorta is normal size without bruit.   EXTREMITIES: 2+ bilateral pulses in dorsal, pedal, radial, and femoral arteries without cyanosis, clubbing, or ulcers and there is a left-sided shunt with an IV access.  LABORATORY DATA: Laboratories as listed but no evidence of elevated white count.   ASSESSMENT: This is a 64 year old male with hypertension, hyperlipidemia, coronary artery disease status post coronary artery bypass graft, valvular heart disease, and atrial fibrillation  remaining in normal sinus rhythm with symptoms suggestive of  bronchitis and/or inflammatory pulmonary abnormalities and no current evidence of myocardial infarction.   RECOMMENDATIONS:  1. Continue hypertension control with current medical regimen including amlodipine, metoprolol, and hydralazine.  2. Further treatment of possible bronchitis or inflammatory changes from the pulmonary standpoint.  3. Further consideration of continuation of amiodarone for heart rate control and maintenance of normal rhythm.  4. Warfarin for further risk reduction in stroke with atrial fibrillation, although would consider discontinuation if the patient maintains normal sinus rhythm to reduce bleeding complications with recurrent dialysis. 5. Aspirin for further risk reduction in stroke and heart attack  6. Ambulate and follow for improvements of symptoms with dialysis tomorrow and further treatment options as necessary with follow-up with cardiology tomorrow as well for further adjustments of medications and further evaluation if chest pain changes, other than pleuritic-type pain.  ____________________________ Lamar BlinksBruce J. Raymonda Pell, MD bjk:slb D: 10/26/2011 15:29:18 ET T: 10/26/2011 17:39:52 ET JOB#: [id number]  cc: Lamar BlinksBruce J. Najia Hurlbutt, MD, <Dictator> Lamar BlinksBRUCE J Zoeya Gramajo MD ELECTRONICALLY SIGNED 10/28/2011 16:25

## 2014-05-29 NOTE — Consult Note (Signed)
Chief Complaint:   Subjective/Chief Complaint Patient is still c/o pain over his left side but in addition c/o pain in neck and pain radiating down the legs. No vomiting. One BM today. Tolerating diet well.   VITAL SIGNS/ANCILLARY NOTES: **Vital Signs.:   22-Nov-13 11:35   Vital Signs Type Routine   Temperature Temperature (F) 97.2   Celsius 36.2   Temperature Source axillary   Pulse Pulse 65   Respirations Respirations 20   Systolic BP Systolic BP 155   Diastolic BP (mmHg) Diastolic BP (mmHg) 71   Mean BP 99   Pulse Ox % Pulse Ox % 94   Pulse Ox Activity Level  At rest   Oxygen Delivery 2L   Brief Assessment:   Additional Physical Exam Mild RUQ tenderness otherwise benign abdomen.   Lab Results: Routine Chem:  22-Nov-13 10:30    Phosphorus, Serum  7.5 (Result(s) reported on 01 Jan 2012 at 10:01AM.)  Cardiac:  22-Nov-13 10:30    CK, Total 94   CPK-MB, Serum 3.6 (Result(s) reported on 01 Jan 2012 at 10:45AM.)   Assessment/Plan:  Assessment/Plan:   Assessment Admitted with abdominal pain with diffuse tenderness. Two CT's are negative except for gallstones. His pain is now mostly in neck, back and side with radiation to legs, ? redicular pain from spine pathology. No signs of acute intra-abdominal pathology.    Plan Consider imaging of back. Continue PPI. I will be back on Monday. Dr. Ricki RodriguezSkulski is on call. He will not make rounds on him over the weekend although will be available to assist in management if called.   Electronic Signatures: Lurline DelIftikhar, Tawanna Funk (MD)  (Signed [phone] [id number]-Nov-13 13:15)  Authored: Chief Complaint, VITAL SIGNS/ANCILLARY NOTES, Brief Assessment, Lab Results, Assessment/Plan   Last Updated: 22-Nov-13 13:15 by Lurline DelIftikhar, Ylianna Almanzar (MD)

## 2014-05-29 NOTE — H&P (Signed)
PATIENT NAME:  Russell [name], Russell [name] MR#:  [id number] DATE OF BIRTH:  [birth year]  DATE OF ADMISSION:  11/27/2011  PRIMARY CARE PHYSICIAN: The patient does not have one.  CARDIOLOGIST:  [facility].  CHIEF COMPLAINT: Chest pain.    HISTORY OF PRESENT ILLNESS: This is a 64 year old male who presents to the Emergency Room after when he went into dialysis suddenly developed some chest pain/epigastric pain. The patient was just recently discharged three days ago from the hospital. The patient said he was in his usual state of health and was feeling fine this morning when he woke up. He ate breakfast, he felt fine. He went to dialysis. Shortly before starting dialysis he developed the sudden onset of chest pressure and also epigastric pain. He denied any nausea or vomiting but did admit to some shortness of breath. No palpitations, no diaphoresis. He was sent urgently over to the ER for further evaluation. The patient does have a history of recent stent placement at [facility].   The patient denies any nausea, cough, fevers, chills, or any other associated symptoms presently. In the Emergency Room, the patient was still noted to be tachycardic despite getting multiple doses of IV Cardizem. Presently he is chest pain free and hemodynamically stable. Hospitalist services were contacted for further treatment and evaluation.     REVIEW OF SYSTEMS: CONSTITUTIONAL: No documented fever. No weight gain or weight loss. EYES: No blurred or double vision. ENT: No tinnitus. No postnasal drip. No redness of the oropharynx. RESPIRATORY: No cough, no wheeze, no hemoptysis. Positive dyspnea. CARDIOVASCULAR: Positive chest pain. No orthopnea, no palpitations, no syncope.  GASTROINTESTINAL: Positive nausea. No vomiting, no diarrhea, no abdominal pain, no melena, no hematochezia. GU: No dysuria, no hematuria. ENDOCRINE: No polyuria or nocturia. No heat or cold intolerance. HEME: No anemia, no bruising, no bleeding.  INTEGUMENT: No rashes. No lesions. MUSCULOSKELETAL: No arthritis, no swelling, and no gout. NEUROLOGIC: No numbness, no tingling, no ataxia, no seizure-type activity. PSYCH: No anxiety, no insomnia, no ADD.   PAST MEDICAL HISTORY:  1. Hypertension. 2. Hyperlipidemia. 3. Endstage renal disease on hemodialysis.  4. History of coronary artery disease status post stent placement. 5. Chronic obstructive pulmonary disease with ongoing tobacco abuse.   ALLERGIES: Statins.   SOCIAL HISTORY: He continues to smoke about 1/2 pack per day. Denies any alcohol abuse. No illicit drug abuse. Currently resides at [facility] skilled nursing facility.   FAMILY HISTORY: Cannot recall his family history.   CURRENT MEDICATIONS:  1. Advair 250/50 1 puff b.i.d.  2. Amlodipine 10 mg daily.  3. Aspirin 325 mg daily.  4. Plavix 75 mg daily.  5. Darbepoetin shots 25 mcg weekly at dialysis.  6. Colace 100 mg b.i.d.  7. Guaifenesin  600 mg b.i.d.  8. Heparin 40 units injectable t.i.d. with dialysis. 9. Hydralazine 50 mg t.i.d.  10. Imdur 30 mg daily.  11. Lanthanum carbonate 1000 mg b.i.d.  12. Xopenex nebulizers q. 4 hours as needed.  13. Levaquin 250 mg daily times four days.  14. Losartan 100 mg daily.  15. Reglan 5 mg t.i.d. .  16. Metoprolol tartrate 25 mg b.i.d.  17. Multivitamin daily.  18. Protonix 40 mg daily.  19. Paricalcitol 6 mcg 3 times weekly.  20. Prednisone taper. He has four more days left with a 10-mg tab taper.  21. Senokot 1 tab b.i.d.  22. Theophylline 100 mg daily.  23. Sevelamer carbonate 800 mg t.i.d. with meals.   PHYSICAL EXAMINATION ON ADMISSION:  VITAL SIGNS: Temperature 98, pulse 74, respirations 18, blood pressure 125/70, sats 100% on 2 liters nasal cannula.   GENERAL: He is a pleasant appearing male in no apparent distress.   HEENT: Atraumatic, normocephalic. Extraocular muscles are intact. Pupils equal and reactive to light. Sclerae anicteric. No conjunctival  injection. No pharyngeal erythema.   NECK: Supple. No jugular venous distention, no bruits, no lymphadenopathy, no thyromegaly.   HEART: Tachycardic, regular. No murmurs, rubs, or clicks.   LUNGS: Clear to auscultation bilaterally. No rales, no rhonchi, no wheezes.   ABDOMEN: Soft, flat, tender in the epigastrium. No rebound, no rigidity. Good bowel sounds. No hepatosplenomegaly appreciated.   EXTREMITIES: No evidence of any cyanosis, clubbing, or peripheral edema. Has +2 pedal and radial pulses bilaterally. The patient also has a left upper extremity AV fistula, which has a good bruit and good thrill and no drainage.   SKIN: Moist and warm with no rashes.   LYMPHATIC: There is no cervical or axillary lymphadenopathy.   NEUROLOGICAL: He is alert, awake, and oriented times three with no focal motor or sensory deficits appreciated bilaterally.   LABORATORY, DIAGNOSTIC, AND RADIOLOGICAL DATA: Serum glucose 109, BUN 92, creatinine 7, sodium 141, potassium 3.6, chloride 99, bicarbonate 26, lipase is 1644. LFTs are within normal limits. Troponin 0.14. White cell count 8.1, hemoglobin 12.9, hematocrit 37.9, platelet count 156, INR 0.9.   The patient did have a chest x-ray done which showed cardiomegaly with mild edema. The patient also had a CT of the abdomen and pelvis done with contrast which showed of a few loops of small bowel demonstrating bowel wall thickening. This may be related to underdistention or chronic diverticulosis. No evidence of any pancreatitis.   ASSESSMENT AND PLAN: This is a 64 year old male with a history of endstage renal disease on hemodialysis, chronic obstructive pulmonary disease with ongoing tobacco abuse, hypertension, gastroesophageal reflux disease, history of coronary artery disease status post stent placement, who presents to the hospital with chest pain/epigastric pain and noted to be severely tachycardic.  1. Chest pain/epigastric pain: The exact etiology is  unclear. Questionable if related to acute pancreatitis versus cardiac chest pain. The patient did have a recent stent placement at [facility]. He presently has no nausea or vomiting. He has been able to tolerate p.o. His CT of the abdomen and pelvis is essentially benign. He has no history of alcohol abuse. I will observe him overnight on telemetry, follow serial cardiac markers. His first set of troponins is mildly elevated, but that is probably related to poor renal clearance. I will continue his aspirin, Plavix, beta blocker, and Imdur. He is intolerant of statins. I will repeat his lipase tomorrow and also get a cardiology consult. The patient is well known to [facility].  2. Tachycardia: This is sinus tachycardia. No evidence of any supraventricular tachycardia or arrhythmia. I will increase his metoprolol dose for now. His heart rate actually improved quite a bit after I gave him one bolus of IV Cardizem. We will follow him clinically.  3. Endstage renal disease on hemodialysis: I will consult nephrology to have him dialyzed today. We will continue dialysis Monday, Wednesday, and Friday as per them.  4. Hypertension, presently hemodynamically stable: Continue Norvasc, Imdur, hydralazine,  metoprolol, and losartan.  5. Gastroesophageal reflux disease: Continue Protonix.  6. Chronic obstructive pulmonary disease: No evidence of any acute exacerbation. Continue Advair and theophylline, finish his recent prednisone taper, continue p.r.n. nebulizers.   CODE STATUS: The patient is a FULL CODE.  TIME SPENT WITH ADMISSION: 50 minutes.   ____________________________ Rolly PancakeVivek J. Cherlynn KaiserSainani, MD vjs:bjt D: 11/27/2011 11:17:30 ET T: 11/27/2011 11:40:39 ET JOB#: [id number]  cc: Rolly PancakeVivek J. Cherlynn KaiserSainani, MD, <Dictator> Houston SirenVIVEK J SAINANI MD ELECTRONICALLY SIGNED 11/29/2011 21:57

## 2014-05-29 NOTE — Discharge Summary (Signed)
PATIENT NAME:  [name], [name] MR#:  [id number] DATE OF BIRTH:  1950-12-06  DATE OF ADMISSION:  01/11/2012 DATE OF DISCHARGE:  01/14/2012  ADMITTING DIAGNOSIS: Chest pain.   DISCHARGE DIAGNOSES:  1. Atrial fibrillation, rapid ventricular response, in sinus rhythm now.  2. Congestive heart failure, left heart, acute on chronic, diastolic. 3. Chronic respiratory failure due to congestive heart failure as well as chronic obstructive pulmonary disease on 1 liter of oxygen through nasal cannula started on this admission.  4. Type II MI due to supply/demand ischemia due to atrial fibrillation/RVR.  5. Chronic obstructive pulmonary disease exacerbation/acute bronchitis.  6. End-stage renal disease, on hemodialysis Mondays, Wednesdays, Fridays.  7. Ongoing tobacco abuse.   DISCHARGE CONDITION: Fair.   DISCHARGE MEDICATIONS: The patient is to continue:  1. Metoprolol tartrate 25 mg p.o. twice daily. 2. Claritin 10 mg p.o. daily.  3. Nephro-Vite 1 tablet once daily. 4. Aspirin 81 mg p.o. daily.  5. Docusate sodium 100 mg p.o. daily.  6. Senna Plus 50/8.6 mg 2 tablets once daily.  7. Clopidogrel 75 mg p.o. daily.  8. Amlodipine 10 mg p.o. daily.  9. Losartan 100 mg p.o. daily.  10. Isosorbide mononitrate 30 mg p.o. daily.  11. Diltiazem 120 mg extended-release 1 capsule once daily.  12. Theophylline 100 mg/24 hours oral capsule extended-release 1 capsule once daily.  13. Acetaminophen/oxycodone 325 mg/5 mg one tablet every six hours as needed.  14. Advair Diskus 250/50 one puff twice daily.  15. Nitroglycerin 0.4 mg sublingually every five minutes as needed.  16. Amiodarone 400 mg p.o. twice daily.  17. Renagel 800 mg p.o. 3 times daily.  18. Fosrenol 1000 mg p.o. twice daily.  19. Levofloxacin 500 mg p.o. every 48 hours x2 doses.  20. Nicotine oral inhaler 10 mg every one hour as needed.  21. Nicotine transdermal patch 14 mg/24 hours once daily.  22. DuoNebs 2.5/0.5 mg in 3 mL  inhalation solution 3 mL 4 times daily.   HOME OXYGEN: Portable tank at 1 liter of oxygen through nasal cannula.   DIET: 2 gram salt, low fat, low cholesterol, hemodialysis diet with fluid restriction. Diet consistency regular.   ACTIVITY LIMITATIONS: As tolerated.   [facility]ERR[facility]. [facility].   FOLLOW-UP:  1. Follow-up appointment with hemodialysis center as previously scheduled.  2. Follow-up with the patient's primary care physician, unfortunately unknown.  3. Follow-up with cardiologist, Dr. Gwen PoundsKowalski, in one week after discharge.    CONSULTANTS:  1. Dr. Darrold JunkerParaschos  2. Dr. Thedore MinsSingh  3. Care Management    RADIOLOGIC STUDIES: Chest x-ray, portable, single view, 01/11/2012, revealed bilateral diffuse interstitial thickening likely representing interstitial edema versus interstitial pneumonitis secondary to infectious or inflammatory etiology.   REASON FOR ADMISSION: The patient is a 64 year old African American male with past medical history significant for end-stage renal disease, history of MI as well as history of atrial fibrillation who presented to the hospital with complaints of midsternal chest pains. Please refer to Dr. Berlinda LastSanchez Gutierrez admission note on 01/11/2012.   PHYSICAL EXAMINATION: On arrival to the Emergency Room, the patient's temperature was 98.2, initially pulse was 148, blood pressure was 90's to 60's after diltiazem as well as amiodarone, oxygen saturation 98% on room air. The patient's physical exam revealed lower extremity edema. Lungs had multiple crackles bilaterally.  LABORATORY DATA: The patient's EKG showed atrial fibrillation with RVR, significant ST depressions in lateral leads.   Lab data done on 01/11/2012 revealed  elevated BUN and creatinine to 20 and 4.14. The patient's estimated GFR for African American would be 17. Otherwise, BMP was unremarkable. Liver enzymes were normal. The patient's cardiac enzymes CK  total was 246, MB fraction was 9.4, troponin was slightly elevated at 0.08 on the first set. The second set revealed MB fraction of 7.7 and troponin 0.55; third set CK total 6.8 and troponin 0.57. White blood cell count was normal at 4.3, hemoglobin 9.6, platelet count 153. Coagulation panel was unremarkable.   HOSPITAL COURSE: The patient was started on heparin IV as well as Cardizem and amiodarone drip. With amiodarone drip, he converted back into sinus rhythm. 1. In regards to atrial fibrillation/RVR, as mentioned above the patient was given IV amiodarone which converted him to sinus rhythm. The patient was changed from IV amiodarone to p.o. He is to continue this medication as well as his usual doses of Cardizem. He is to follow-up with his primary care physician as well as cardiologist, Dr. Gwen Pounds, in the next week after discharge.  2. In regards to chest pains, as the patient's troponin was slightly elevated it was felt to be demand ischemia or type II MI related to atrial fibrillation/RVR. The patient was evaluated by cardiologist who did not feel that he needs to undergo any other studies. He was recommended to walk around especially since his heart rate improved and follow symptoms. His symptoms resolved and the patient is being discharged home. His vital signs on the day of discharge revealed temperature 97.9, pulse 67, respiration rate 20, blood pressure 155/75, saturation was 98% on 2 liters of oxygen through nasal cannula.  3. In regards to CHF, left heart, acute on chronic, diastolic, the patient was noted to have congestive heart failure on chest x-ray as well as clinically. He was taken to hemodialysis and he improved, however, his improvement was not adequate. He was still having some expectoration with yellow phlegm and he was initiated on antibiotic therapy as well as medications for COPD. He is to continue inhalation therapy with Advair as well as DuoNebs and continue antibiotic therapy  for two more doses which are supposed to be taken every 48 hours. We tried to obtain sputum cultures, however, results were not available at this time of dictation.  4. Regarding respiratory failure, the patient's oxygenation remained stable, however, the patient still required oxygen therapy despite therapy for COPD as well as CHF. It was felt that the patient would benefit from oxygen therapy at home. He was qualified for oxygen and he is to use 1 liter of oxygen through nasal cannula 24/7. This was discussed with Dr. Thedore Mins who agreed for chronic oxygen therapy.  5. For end-stage renal disease, the patient is to continue hemodialysis as usually scheduled.  6. For tobacco abuse, the patient had discussion with me about his tobacco abuse issues as well as worsening COPD. He was advised to quit and nicotine replacement therapy was initiated.   CONDITION ON DISCHARGE: The patient is being discharged in stable condition with the above-mentioned medications and follow-up.   TIME SPENT: 40 minutes.   ____________________________ Katharina Caper, MD rv:drc D: 01/14/2012 15:43:48 ET T: 01/15/2012 09:37:04 ET JOB#: [id number]  cc: Katharina Caper, MD, <Dictator> Lamar Blinks, MD Senovia Gauer MD ELECTRONICALLY SIGNED 01/23/2012 16:15

## 2014-06-01 NOTE — Discharge Summary (Signed)
PATIENT NAME:  Russell [name], Russell [name] MR#:  [id number] DATE OF BIRTH:  27-Feb-1950  DATE OF ADMISSION:  04/18/2012 DATE OF DISCHARGE:  04/22/2012   [facility].   [facility].   PRESENTING COMPLAINT: Shortness of breath and hyperkalemia.   DISCHARGE DIAGNOSES:  1.  Acute on chronic hypoxic respiratory failure secondary to congestive heart failure/flash pulmonary edema, acute on chronic diastolic, due to missing 3 hemodialysis sessions last week. The patient is status post hemodialysis several sessions with ultrafiltration, now on home oxygen nasal cannula 3 liters per minute.  2.  Acute non-Q-wave myocardial infarction with elevated troponin with history of coronary artery disease.  3.  Acute hyperkalemia with EKG changes, resolved.  4.  End-stage renal disease.  5.  Anemia of end-stage renal disease.  6.  Type 2 diabetes.  7.  Left heel pressure ulcer.  8.  Generalized debilitation.   CODE STATUS: No code, DNR.   CONSULTATIONS:  1.  Dr. Lady GaryFath, cardiology.  2.  Dr. Thedore MinsSingh, nephrology.   PROCEDURES: Cardiac catheterization done by Dr. Lady GaryFath on 04/21/2012 showed native coronary lesions, distal left main 50%, proximal LAD 100%, ostial circumflex 90%, proximal circumflex lesion one 50%, mid circumflex lesion one 70%, OM1 with 75%, mid RCA 99%, distal RCA 99%. Medical management is recommended by cardiology.   MEDICATIONS AT DISCHARGE:  1.  Tylenol 650 p.o. q.4 p.r.n.  2.  Docusate 100 mg b.i.d.  3.  Senokot-S 1 tablet p.o. b.i.d. p.r.n.  4.  Cozaar 100 mg daily.  5.  Imdur 60 mg daily.  6.  Diltiazem 120 mg daily.  7.  Plavix 75 mg daily.  8.  Advair Diskus 250/50, 1 puff b.i.d.  9.  Multivitamin p.o. daily.  10.  Metoprolol XL 25 mg daily.  11.  Hydralazine 25 mg t.i.d.  12.  PhosLo 2 capsules 3 times a day.  13.  Gabapentin 100 mg at bedtime.   DIET: Renal diet.   FLUID RESTRICTION: To 1500 mL.   DISCHARGE INSTRUCTIONS:  1.   Continue hemodialysis Monday, Wednesday, Friday.  2.  Physical therapy.  3.  Oxygen 2 to 3 liters per minute continuous.  4.  Left heel dressing changes per instructions.   BRIEF SUMMARY OF HOSPITAL COURSE: The patient is a 64 year old African American gentleman well known to our service with history of end-stage renal disease on hemodialysis, history of CHF, coronary artery disease, ongoing tobacco abuse, came to the hospital after he missed 3 sessions of dialysis last week. He was admitted with:  1.  Acute on chronic hypoxic respiratory failure secondary to congestive heart failure, acute on chronic diastolic with flash pulmonary edema. The patient underwent urgent hemodialysis on the day of admission with ultrafiltration and underwent several sessions in a row with ultrafiltration. He was initially placed on BiPAP, now on 3 liters nasal cannula oxygen, which is his baseline.  2.  Acute non-Q-wave MI with elevated troponin. The patient has history of severe CAD with PCI in September 2013. He was started on heparin drip with elevated troponin at admission, nitroglycerin, per rectal aspirin, oral metoprolol was given. The patient underwent catheterization after Dr. Lady GaryFath saw patient. The patient has severe 3-vessel disease. Recommendation at this time is to manage medically.  3.  Acute hyperkalemia with EKG changes with peaked T waves, resolved after the patient got urgent dialysis at admission.  4.  End-stage renal disease. Dr. Thedore MinsSingh saw the patient. His dialysis has been continued in-house according to  his routine and did get he did get a couple of extra sessions for ultrafiltration.  5.  Anemia of CKD, stable.  6.  Type 2 diabetes. Sliding scale insulin.  7.  Left heel pressure ulcer for 2 weeks. It was painful with 4 cm ulcer on the heel without signs of cellulitis. Ulcer base has some granulation and necrosis. Wound care consult was done and recommended dressing changes, which will be carried out at  the skilled facility.  8.  Physical therapy saw the patient and recommended rehab. The patient had been reluctant to go to rehab initially. However, I discussed with him his chronic multiple problems, left wound dressing changes, weakness, debility and no help at home other than the patient's brother who comes a couple of hours a day. The patient is now agreeable to go to rehab. His overall prognosis appears to be poor. The patient did voice understanding. The patient remained a no code, DNR.   TIME SPENT: 40 minutes.    ____________________________ Russell Hail Allena Katz, MD sap:jm D: 04/22/2012 14:52:08 ET T: 04/22/2012 15:50:17 ET JOB#: [id number]  cc: Sona A. Allena Katz, MD, <Dictator> Darlin Priestly. Lady Gary, MD Mosetta Pigeon, MD [facility] Willow Ora MD ELECTRONICALLY SIGNED 05/07/2012 13:03

## 2014-06-01 NOTE — Consult Note (Signed)
Pt in dialysis. No report of abd pain since angiogram and stent placement. Will sign off. Thanks.  Electronic Signatures: Lutricia Feilh, Rozina Pointer (MD)  (Signed on 16-Apr-14 16:47)  Authored  Last Updated: 16-Apr-14 16:47 by Lutricia Feilh, Ruvi Fullenwider (MD)

## 2014-06-01 NOTE — H&P (Signed)
PATIENT NAME:  [name], Athen G MR#:  [id number] DATE OF BIRTH:  02/08/1951  DATE OF ADMISSION:  07/09/2012  REFERRING PHYSICIAN: Dr. Suella BroadLinda Taylor.   NEPHROLOGIST: Dr. Thedore MinsSingh.  CHIEF COMPLAINT: Chest pain.    HISTORY OF PRESENT ILLNESS: Mr. Jarold Mottoatterson is a 64 year old African American male  with a past medical history of coronary artery disease with multivessel disease, recommended medical management; hypertension, hyperlipidemia, end-stage renal disease, on hemodialysis on a Monday, Wednesday, Friday schedule who comes to the Emergency Department with complaints of chest pain. The patient has not been feeling well for the last two days, with  generalized weakness. This evening started to experience chest pain on the left side of the chest, pressure-like pain, associated with severe shortness of breath. Considering this, the patient was brought to the Emergency Department. Workup in the Emergency Department revealed mild elevation of the CK-MB. No changes were shown on the EKG.   The patient was admitted with similar complaints in March 2014. At that time, the patient was also admitted with severe congestive heart failure from missing her dialysis. The patient was found to have a non-ST elevation myocardial infarction. The patient underwent left heart cath by Dr. Lady GaryFath which showed multivessel severe disease, who recommended medical management. The patient was recommend recommended to stop smoking. However, the patient continues to smoke 1 pack a day.   The patient had another admission in April 2014 for nausea, vomiting and abdominal pain. 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 The patient's last dialysis was on Friday. Concerning the patient's mild elevation of the CK-MB the patient was initiated on a heparin drip. The patient is quite somnolent from getting the morphine, however was arousable and answered questions  appropriately.   PAST MEDICAL HISTORY: 1.  Hypertension.  2.  Hyperlipidemia.  3.  End-stage renal disease, on hemodialysis Monday, Wednesday, Friday schedule.   4. Coronary artery disease, with a left heart cath in March 2014; recommended medical management.  5.  History of atrial fibrillation, on chronic anticoagulation.  6.  Diastolic congestive heart failure.  7. Chronic respiratory failure, secondary to COPD and diastolic heart failure, home oxygen-dependent.  8.  Hyperlipidemia.  9.  Noncompliance.   PAST SURGICAL HISTORY: Appendectomy.   ALLERGIES: STATINS.   HOME MEDICATIONS: 1.  Rena-Vite 1 tablet once a day.  2.  Losartan 100 mg once a day.  3.  Insulin, Aspart, subcutaneous 4 times a day.  4.  Gabapentin 100 mg 3 capsules 3 times a day.  5.  Fosrenol 1000 mg 2 tablets 3 times a day.  6.  Diltiazem 120 mg, 1 capsule once a day.  7.  Plavix 75 mg 1 tablet once a day.  8.  Norco 5/325 milligrams 1 tablet every 4 hours as needed.   SOCIAL HISTORY: Continues to smoke 1 pack a day. Denies drinking alcohol or using illicit drugs.   Lives currently with his brother and his wife.   FAMILY HISTORY: Mother had kidney disease and hypertension.   REVIEW OF SYSTEMS: The patient is somnolent. Unable to obtain most of the history however. Arousable. Answered simple sentences, simple words.  CONSTITUTIONAL: Generalized weakness.  EYES: No blurred vision.  ENT: Any tinnitus or change in hearing.   RESPIRATORY: Has cough, mild shortness of breath.  CARDIOVASCULAR: Chest pain, palpitations.  GASTROINTESTINAL:  Has some abdominal pain, mild nausea.  GENITOURINARY: Makes some urine. Patient has end-stage renal disease.  HEMATOLOGIC:  No easy bruising or bleeding.  NEUROLOGIC: No weakness or numbness in any part of the body.   PHYSICAL EXAMINATION: GENERAL: This is a well-built, well-nourished, age-appropriate male laying down in the bed, not in distress.  VITAL SIGNS: Temperature  97.8, pulse 64, blood pressure 140/70, respiratory rate of  15, oxygen saturations 96% on room air on 2 liters of oxygen.  HEENT: Head normocephalic, atraumatic. There is no scleral icterus. Conjunctivae normal. Pupils equal, round and reactive to light. Extraocular movements are intact. Mucous membranes: Mild dryness. No pharyngeal erythema.  NECK: Supple. No lymphadenopathy. No JVD. No carotid bruit. No thyromegaly.  CHEST: Has no focal tenderness.  LUNGS: Somewhat coarse breaths bilaterally.  HEART: S1, S2 regular. No murmurs are heard.  ABDOMEN: Obese. Bowel sounds present. Soft, nontender, nondistended. No hepatosplenomegaly.   MUSCULOSKELETAL: Good range of motion in all the extremities.  SKIN: No rashes or lesions.  LYMPHATIC: No inguinal or axillary lymphadenopathy.  NEUROLOGIC: The patient is somnolent, arousable, oriented to place, person and time. No apparent cranial nerve abnormalities. Motor: Moving all four extremities. I did not check the  sensory.   LABORATORY DATA: BUN 39, creatinine of 7.27, calcium 10.9. The rest of the values are within normal limits. Troponin of 0.04, CK 97, CK-MB of 5.6.   CBC: Hemoglobin 12. The rest of the values are within normal limits.   EKG, 12-lead: Normal sinus rhythm, with no ST-T wave abnormalities.   ASSESSMENT AND PLAN: Mr. Kollmann is a 64 year old male who comes to the Emergency Department with complaints of chest pain.   1.  Chest pain: Admitted with chest pain. The patient has a known history of extensive coronary artery disease with recommended medical management. The patient continued to smoke. Continue with heparin drip considering the patient's high probability of having a myocardial infarction. Continue with aspirin, Plavix. Continue to cycle cardiac enzymes x 3.  2.  End-stage renal disease, on hemodialysis Monday, Wednesday, Friday schedule.  3.  Hypertensive urgency: Currently moderately well-controlled. Continue the home  medications.  4.  Continued tobacco use: Counseled with the patient, however the patient seems to have poor insight.  5.  Extensive peripheral vascular disease, with a recent mesenteric artery stenosis, status post stent placement, with improvement of his symptoms.  6.  Keep the patient on deep vein thrombosis prophylaxis: The patient will be on a therapeutic dose of heparin.   Time spent: Forty-five minutes.    ____________________________ Susa Griffins, MD pv:dm D: 07/10/2012 05:59:20 ET T: 07/10/2012 07:26:49 ET JOB#: [id number]  cc: Susa Griffins, MD, <Dictator> Susa Griffins MD ELECTRONICALLY SIGNED 07/11/2012 0:54

## 2014-06-01 NOTE — H&P (Signed)
PATIENT NAME:  Russell [name], Russell [name] MR#:  [id number] DATE OF BIRTH:  1950/03/17  DATE OF ADMISSION:  04/01/2012  CHIEF COMPLAINT: Shortness of breath.   HISTORY OF PRESENTING ILLNESS: A 64 year old male patient with a history of coronary artery disease, end-stage renal disease on hemodialysis, presented to the Emergency Room from nursing home complaining of worsening shortness of breath. The patient has been found to have a right lower lobe infiltrate along with pulmonary edema and is being admitted to the hospitalist service with acute respiratory failure on a BiPAP. The patient is in severe respiratory distress and critically ill.   He does not complain of any chest pain. Has yellow productive cough. Afebrile. He did have dialysis 2 days prior. I discussed the case with Dr. Cherylann Ratel and the patient will get dialysis today.   PAST MEDICAL HISTORY: Coronary artery disease, chronic diastolic CHF, COPD, tobacco abuse, end-stage renal disease on hemodialysis, renal transplant in the past.   ALLERGIES: STATINS.   SOCIAL HISTORY: The patient smokes 1/2 pack a day. No alcohol. No illicit drugs. Lives in a nursing home. Ambulates with a walker.   CODE STATUS: DNR/DNI.   FAMILY HISTORY: Mother had chronic kidney disease and hypertension.   HOME MEDICATIONS: Include: 1.  Advair Diskus 250/50, 1 puff inhaled twice a day.  2.  Aller-G 25 mg oral every 6 hours as needed.  3.  Amiodarone 400 mg oral once a day.  4.  Amlodipine 10 mg oral once a day.  5.  Aspirin 81 mg oral once a day.  6.  Claritin 10 mg once a day.  7.  Docusate sodium 100 mg twice a day.  8.  DuoNeb q.4h. p.r.n.  9. Fosrenol 1000 mg oral 2 times a day.  10.  Isosorbide mononitrate 60 mg oral once a day.  11.  Losartan 100 mg oral once a day.  12.  Metoprolol tartrate 25 mg oral twice a day.  13.  Nitroglycerin 0.4 mg sublingual as needed for chest pain.  14.  Norco 325 oral every 4 hours as needed for pain.  15.  Plavix 75 mg oral  once a day.  16.  Renagel 800 mg 2 tablets oral 3 times a day.  17.  Theophylline 100 mg oral once a day.   REVIEW OF SYSTEMS: Please see history of presenting illness. Rest of the systems reviewed and negative except the ones mentioned in history of presenting illness.   PHYSICAL EXAMINATION:  VITAL SIGNS: Temperature 98.2, pulse 74, respirations 19, blood pressure 156/69, saturating 100% on BiPAP.  GENERAL: Obese African American male patient lying in bed in significant respiratory distress.  PSYCHIATRIC: Alert, oriented x 3. Mood and affect appropriate. Judgment intact.  HEENT: Atraumatic, normocephalic. Oral mucosa dry and pink. External ears and nose normal. Pallor positive. No icterus. Pupils bilaterally equal and reactive to light.  NECK: Supple. No thyromegaly. No palpable lymph nodes. Trachea midline. No carotid bruits or JVD.  CARDIOVASCULAR: S1, S2 tachycardic, 1+ edema in lower extremities.  RESPIRATORY: Increased work of breathing. Bilateral crackles and wheezes.  GASTROINTESTINAL: Soft abdomen, nontender. Bowel sounds present.  SKIN: Warm and dry. No petechiae, rash, ulcers.  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  MUSCULOSKELETAL: No joint swelling, redness, effusion of large joints. Normal muscle tone.  NEUROLOGICAL: Motor strength 5/5 in upper extremities.   LABORATORY, DIAGNOSTIC AND RADIOLOGICAL DATA: Lab studies show glucose of 89, BUN 38, creatinine of 7.41. BNP 27,500. WBC 5.1, hemoglobin 9.6, platelets 174.  ABG shows pH of 7.37, pCO2 of 46 and pO2 of 45.   EKG shows normal sinus rhythm with poor R wave progression, nonspecific ST-T wave changes, no ST elevation found.   Chest x-ray shows right lower lobe infiltrate, pulmonary edema and fluid in the pulmonary fissures.   ASSESSMENT AND PLAN:  1.  Acute respiratory failure secondary to right lower lobe pneumonia with healthcare-acquired pneumonia and pulmonary edema.  2.  Healthcare-acquired  pneumonia. Will start the patient on broad-spectrum antibiotics and obtain blood and sputum cultures. Will repeat a chest x-ray in the morning. Nebulizers and oxygen support with BiPAP.  3.  Acute on chronic diastolic congestive heart failure. Discussed the case with Dr. Cherylann RatelLateef and the patient will get dialysis later today. The patient is on a beta blocker and angiotensin-converting enzyme inhibitor.  4.  Chronic anemia secondary to end-stage renal disease, stable.  5.  Tobacco abuse. Counseled the patient for more than 3 minutes to quit smoking. The patient mentioned that he is trying to cut down. Will place him on a nicotine patch.   CODE STATUS: DNR/DNI.   TIME SPENT: Time spent today on this critically ill patient on BiPAP with acute respiratory failure needing urgent dialysis was 55 minutes.   ____________________________ Molinda BailiffSrikar R. Trenise Turay, MD srs:jm D: 04/01/2012 16:28:01 ET T: 04/01/2012 16:53:13 ET JOB#: [id number]  cc: Wardell HeathSrikar R. Elpidio AnisSudini, MD, <Dictator> Munsoor Lizabeth LeydenN. Lateef, MD Orie FishermanSRIKAR R Alden Feagan MD ELECTRONICALLY SIGNED 04/02/2012 13:03

## 2014-06-01 NOTE — Op Note (Signed)
PATIENT NAME:  Russell [name], Russell [name] MR#:  [id number] DATE OF BIRTH:  [birth year]  DATE OF PROCEDURE:  05/20/2012  PREOPERATIVE DIAGNOSES: 1.  Abdominal pain.  2.  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 3.  Coronary artery disease.   POSTOPERATIVE DIAGNOSES: 1.  Abdominal pain.  2.  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  3.  Coronary artery disease.   PROCEDURES PERFORMED:  1.  Abdominal aortogram.  2.  Selective injection of the superior mesenteric artery.  3.  Selective injection of the celiac artery.  4.  Percutaneous transluminal angioplasty and stent placement of the celiac artery at its origin.   SURGEON:  Russell [name], M.D.   SEDATION:  Precedex drip.   FLUOROSCOPY TIME:  13.7 minutes.   CONTRAST USED:  Isovue 180 mL.   INDICATIONS:   The patient is a 64 year old gentleman that presented complaining of 10/10 abdominal pain. The etiology is uncertain, but CT with contrast has demonstrated significant atherosclerotic occlusive disease involving the superior mesenteric and celiac arteries. The risks and benefits for angiography and possible intervention were reviewed. All questions answered. The patient has agreed to proceed.   DESCRIPTION OF PROCEDURE:  The patient is taken to the Special Procedure Suite, placed in the supine position. After adequate sedation with Precedex has been achieved, he is positioned with his right arm extended palm upward. Ultrasound is placed in a sterile sleeve, the brachial artery is identified at the antecubital fossa. It is echolucent and pulsatile indicating patency. Image is recorded and under direct ultrasound visualization, a micropuncture needle is inserted, microwire followed by microsheath, J-wire followed by a 5 French sheath. A pigtail catheter with a J-wire and then a stiff angled Glidewire is then negotiated into the descending aorta and the pigtail catheter is advanced down to the level of T11. A  steep RAO projection, almost a lateral, is then obtained. The Glidewire is then negotiated into the superior mesenteric artery and the 5-French sheath is exchanged for a 6-French shuttle sheath, which is 90 cm in length. The shuttle sheath is then placed at the origin and magnified images are obtained. The sheath and wire are then advanced at 6 cm into the SMA and multiple projections were obtained. The catheter was backed out again to the origin and again in multiple projections and different obliquities are obtained. After review of these images, the SMA appears to be patent with only mild to moderate disease and no intervention is planned. The sheath is then pulled cranial slightly and the Glidewire used to engage celiac. 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 The Glidewire is advanced into the celiac followed by a straight catheter. Hand injection of contrast verifies intraluminal placement within the celiac artery, actually within the splenic, and subsequently initially a 6 x 18 Omnilink stent is deployed. This appears post deployment to be under sized. Attempts at passing a 7 mm balloon pushed the stent forward beyond the origin and therefore the original lesion is no longer covered. With this situation, a 7 x 18 stent is then advanced over the wire and into the celiac again leaving the stent protruding about 3 mm into the aorta, and it is then inflated to 17 atmospheres for a total diameter of 7.5. Followup angiography demonstrates the stents are widely patent and the celiac artery now rapidly fills with an improved flow. The sheath is pulled into the aorta and  then exchanged for an 11 cm sheath. ACT is obtained. This is 220 and so after one hour the sheath is pulled and manual pressure is held for 20 minutes. There were no immediate complications. The patient did note some chest pain toward the end of the procedure,  but this responded to morphine and several sublingual nitroglycerin.   INTERPRETATION:  Initial views demonstrate diffuse atherosclerotic changes of the aorta, however, in the visualized portions of the aorta there are no hemodynamically significant stenoses.   The SMA demonstrates some atherosclerotic changes at its origin; however, in both RAO, LAO, AP views a maximal diameter reduction of approximately 40% is noted and therefore intervention was not performed. In the AP projection with the catheter in the SMA by several centimeters, it should be noted that he has very poor distal filling in the small vessels, and there is no mucosal blush. There is no venous phase identified in spite of using full-strength contrast. This suggests a very distal small vessel/microvascular or hypoperfusion syndrome. It is not surgically correctable.   The celiac as noted demonstrates an 80% stenosis at its origin. After stent placement as noted above, there is no wide patency and there is improved filling particularly of the hepatic portion on hand injection of contrast.   SUMMARY:  Successful recanalization of the celiac access with stent placement and dilatation to 7.5 mm as described above.   ____________________________ Russell DillsGregory Russell. Yancy Knoble, [name] ggs:jm D: 05/20/2012 17:59:00 ET T: 05/21/2012 12:26:24 ET JOB#: [id number]  cc: Russell PigeonHarmeet Singh, [name] Russell DillsGregory Russell. Russell Hennes, [name], <Dictator>   Russell DillsGREGORY Russell [name] ELECTRONICALLY SIGNED 06/20/2012 13:46

## 2014-06-01 NOTE — Consult Note (Signed)
Brief Consult Note: Diagnosis: abdominal pain nausea and emesis, likely infectious gastroenteritis, no signs of bowel ischemia.   Patient was seen by consultant.   Consult note dictated.   Recommend further assessment or treatment.   Discussed with Attending MD.   Comments: no surgical indications at present, recommend medical admission.  we will follow with you in hospital.  Electronic Signatures: Natale LayBird, Afrika Brick (MD)  (Signed 06-Apr-14 22:56)  Authored: Brief Consult Note   Last Updated: 06-Apr-14 22:56 by Natale LayBird, Lacresia Darwish (MD)

## 2014-06-01 NOTE — Consult Note (Signed)
Chief Complaint:  Subjective/Chief Complaint Pt still having diffuse abd pain this AM. Pt agrees with having EGD this AM, though patient is at increased risk. WIll discuss with vasular regarding angiogram.   VITAL SIGNS/ANCILLARY NOTES: **Vital Signs.:   11-Apr-14 06:12  Vital Signs Type Routine  Temperature Temperature (F) 98.1  Celsius 36.7  Temperature Source oral  Pulse Pulse 69  Respirations Respirations 18  Systolic BP Systolic BP 650  Diastolic BP (mmHg) Diastolic BP (mmHg) 61  Mean BP 82  Pulse Ox % Pulse Ox % 95  Pulse Ox Activity Level  At rest  Oxygen Delivery 2L   Brief Assessment:  Cardiac Regular   Respiratory clear BS   Gastrointestinal diffuse tenderness   Lab Results:  Hepatic:  10-Apr-14 05:05   Albumin, Serum  3.3  Routine Chem:  10-Apr-14 05:05   Phosphorus, Serum  8.9  Glucose, Serum 81  BUN  35  Creatinine (comp)  9.06  Sodium, Serum 137  Potassium, Serum 4.7  Chloride, Serum  97  CO2, Serum 28  Calcium (Total), Serum 9.7  Anion Gap 12  Osmolality (calc) 281  eGFR (African American)  6  eGFR (Non-African American)  6 (eGFR values <36m/min/1.73 m2 may be an indication of chronic kidney disease (CKD). Calculated eGFR is useful in patients with stable renal function. The eGFR calculation will not be reliable in acutely ill patients when serum creatinine is changing rapidly. It is not useful in  patients on dialysis. The eGFR calculation may not be applicable to patients at the low and high extremes of body sizes, pregnant women, and vegetarians.)  Routine Hem:  10-Apr-14 05:05   WBC (CBC) 4.6  RBC (CBC)  3.62  Hemoglobin (CBC)  11.3  Hematocrit (CBC)  34.7  Platelet Count (CBC) 153  MCV 96  MCH 31.2  MCHC 32.6  RDW  17.7  Neutrophil % 68.1  Lymphocyte % 10.6  Monocyte % 13.1  Eosinophil % 7.3  Basophil % 0.9  Neutrophil # 3.1  Lymphocyte #  0.5  Monocyte # 0.6  Eosinophil # 0.3  Basophil # 0.0 (Result(s) reported on 19 May 2012 at 05:53AM.)   Radiology Results: CT:    06-Apr-14 21:32, CT Abdomen and Pelvis With Contrast  CT Abdomen and Pelvis With Contrast   REASON FOR EXAM:    (1) severe diffuse abd pain with peritoneal findings   on exam; (2) same; dialysis  COMMENTS:       PROCEDURE: CT  - CT ABDOMEN / PELVIS  W  - May 15 2012  9:32PM     RESULT: Axial CT scanning was performed through the abdomen andpelvis   with reconstructions at 3 mm intervals and slice thicknesses. The patient   received 85 cc of Isovue 300 for the study. However, the patient was   retching and vomiting prior to and during the IV contrast administration.   The patient did not receive oral contrast material.    The amount of contrast visible within the vasculature is very limited.   There is some enhancement demonstrated of the the liver and spleen.  The native kidneys are markedly abnormal with innumerable cysts present.  There are vascular calcifications as well. A transplanted kidney in the   lower abdomen and upper pelvis on the right exhibits no evidence of   obstruction. It contains vascular calcifications. The partially distended   urinary bladder is grossly normal. The prostate gland produces a mild   impression upon the urinary  bladder base.     The stomach is partially distended with fluid and gas. There is   thickening of the wall of the duodenum diffusely. There is thickening of   the wall of jejunal loops as well. More distally the small bowel is   normal in caliber and does not exhibit wall thickening. There is no   classic obstructive pattern. The ascending colon exhibits a normal stool   and gas pattern as does the transverse colon. There is subjective   thickening of the wall of the descending colon but it is poorly   distended. The rectosigmoid colon is grossly normal.   The liver exhibits no focal mass nor significant intrahepatic ductal   dilation. There is a small amount of calcific density in the  dependent   portion of the gallbladder which may reflect multiple tiny stones or   radiodense bile. There is a small amount of free fluid in the pelvis. The   caliber of the abdominal aorta is normal. There are no adrenal masses.   The psoas musculature is normal in appearance.    The lung bases exhibit mild emphysematous changes. In addition there is   subsegmental atelectasis in the posterior costophrenic gutters. The   lumbar vertebral bodies are preserved in height.    IMPRESSION:   1. There is an abnormal appearance of the duodenum and portions of the   jejunum due thickening of the bowel wall. As best as can be determined   the ileum is normal in appearance. There is no classic obstructive     pattern. No free extraluminal gas collections are demonstrated. The colon   is not clearly abnormal. The findings suggest enteritis although possibly   mesenteric ischemia could be present in the appropriate clinical setting.   These findings appear new since December 30, 2011.  2. The native kidneys as well as a transplanted kidneys do not   significantly enhance.  3. A small amount of radiodense material in the dependent portion of the   gallbladder may reflect stones or radiodense bile.  4. There is a small amount of atelectasis in the posterior costophrenic   gutters bilaterally.     Dictation Site: 5        Verified By: DAVID A. Martinique, M.D., MD   Assessment/Plan:  Assessment/Plan:  Assessment Diffuse abd pain. SB findings.   Plan Will plan EGD this AM. Agree that patient needs angiogram. THanks   Electronic Signatures: Verdie Shire (MD)  (Signed 11-Apr-14 11:01)  Authored: Chief Complaint, VITAL SIGNS/ANCILLARY NOTES, Brief Assessment, Lab Results, Radiology Results, Assessment/Plan   Last Updated: 11-Apr-14 11:01 by Verdie Shire (MD)

## 2014-06-01 NOTE — Consult Note (Signed)
CC: abd pain. Pt got a stent for mesenteric ischemia and an EGD which showed only  a small hiatal hernia.  Pt in dialysis at this time.  No further GI recommendations at this time.  Electronic Signatures: Scot JunElliott, Robert T (MD)  (Signed on 12-Apr-14 11:11)  Authored  Last Updated: 12-Apr-14 11:11 by Scot JunElliott, Robert T (MD)

## 2014-06-01 NOTE — Consult Note (Signed)
Pt not in room. Went down for dialysis. Talked to hospitalist. No mention on when or if angiogram will be done. Will tentatively plan on EGD tomorrow to evaluate small intestine. Liquid diet today but NPO after MN except meds. Thanks  Electronic Signatures: Lutricia Feilh, Tranesha Lessner (MD)  (Signed on 10-Apr-14 13:46)  Authored  Last Updated: 10-Apr-14 13:46 by Lutricia Feilh, Edynn Gillock (MD)
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
## 2014-06-01 NOTE — Consult Note (Signed)
Only given propofol for EGD. Done quickly. Only hiatal hernia seen. Proximal small intestine looks normal. Stomach normal as well. Pt does need to get mesenteric angiogram. Dr. Mechele CollinElliott will cover this weekend. Thanks.  Electronic Signatures: Lutricia Feilh, Orlan Aversa (MD)  (Signed on 11-Apr-14 12:21)  Authored  Last Updated: 11-Apr-14 12:21 by Lutricia Feilh, Lyrical Sowle (MD)

## 2014-06-01 NOTE — Consult Note (Signed)
Despite drinking prep until 1 AM last night, patient not cleaned out at all. Solid stools in rectum and sigmoid colon. Elected to stop. Keep on clear liquid over the weekend. Will need repeat prep on Sun night for repeat colon on Monday. Dr. Mechele CollinElliott will check on patient over the weekend. Thanks.  Electronic Signatures: Lutricia Feilh, Winthrop Shannahan (MD)  (Signed on 11-Apr-14 11:45)  Authored  Last Updated: 11-Apr-14 11:45 by Lutricia Feilh, Kashina Mecum (MD)

## 2014-06-01 NOTE — H&P (Signed)
PATIENT NAME:  Russell [name], Russell [name] MR#:  [id number] DATE OF BIRTH:  Nov 01, 1950  DATE OF ADMISSION:  05/16/2012  REFERRING PHYSICIAN: Dr. Si Raider.   CHIEF COMPLAINT: Abdominal pain, nausea, vomiting.   HISTORY OF PRESENT ILLNESS: The patient is a 64 year old African American male with a past medical history of end-stage renal disease on hemodialysis Monday, Wednesday, Friday, coronary artery disease status post CABG and recent non-Q MI in March, congestive heart failure who presented to the Emergency Department with complaints of abdominal pain that started since yesterday at 1:30, associated with multiple episodes of nausea and vomiting. The patient has been vomiting bilious fluid. The patient states prior to that, he ate some food at the seafood restaurant where he eats from time to time. Had multiple episodes of vomiting with severe abdominal pain. The pain is 10 by 10 intensity, diffuse. The patient also states that it radiates to the back, sharp in nature. The patient is moaning, restless in the bed. Received Zofran and Dilaudid. Workup in the Emergency Department with CT abdomen and pelvis showed diffuse thickening of the wall of the duodenum and also thickening of the jejunum. It showed mildly distended stomach. Concerning this, surgery was consulted. Seen by Dr. Egbert Garibaldi. Considering the patient had a normal white blood cell count and normal lactic acid and CT abdomen does not show any pneumatosis, felt that this was more from gastroenteritis. Recommended to continue the medical management. The patient was not cooperative answering the questions and moaning and restless in the bed. The patient denies having any sick contacts or eating any leftover food.   PAST MEDICAL HISTORY:  1. Hypertension.  2. Hyperlipidemia.  3. Coronary artery disease, status post CABG.  4. End-stage renal disease, on hemodialysis Monday, Wednesday, Friday.   5. Congestive heart failure, diastolic.  6. Chronic atrial  fibrillation.  7. COPD.    PAST SURGICAL HISTORY:  1. Appendectomy.  2. Coronary artery bypass grafting.  3. Stent placement in September 2013.   ALLERGIES: STATIN.   HOME MEDICATIONS:  1. Rena-Vite 1 tablet once a day.  2. Nitrostat 0.4 mg every 5 minutes as needed.  3. Losartan 100 mg once a day.  4. Imdur 60 mg once a day.  5. Hydralazine 50 mg 2 times a day.  6. Gabapentin 300 mg 3 times a day.  7. Fosrenol 2000 g 3 times a day.  8. Diltiazem 120 mg once a day.  9. Plavix 75 mg once a day.  10. Amlodipine 10 mg once a day.  11. Advair Diskus 1 puff 2 times a day.  12. Acetaminophen every 4 hours as needed.   SOCIAL HISTORY: Continues to smoke 1/2 pack a day. Denies drinking alcohol or using illicit drugs. Lives by himself.   FAMILY HISTORY: Mother had chronic kidney disease and hypertension.   REVIEW OF SYSTEMS:  CONSTITUTIONAL: Fatigue, weakness.  EYES: No change in vision.  ENT: No runny nose or sore throat.  RESPIRATORY: No cough or shortness of breath.  CARDIOVASCULAR: No chest pain, palpitations.  GASTROINTESTINAL: Nausea, vomiting, severe abdominal pain.  GENITOURINARY: Makes a small amount of urine. No dysuria or hematuria.  ENDOCRINE: No polyuria or polydipsia.  HEMATOLOGIC: No easy bruising or bleeding.  SKIN: No rash or lesions.  MUSCULOSKELETAL: No joint pains and aches.  NEUROLOGIC: No weakness or numbness.  PSYCHIATRIC: Looks anxious.   PHYSICAL EXAMINATION:  GENERAL: This is a well-built, well-nourished, age-appropriate male lying down in the bed in moderate to severe distress from the  pain.  VITAL SIGNS: Temperature 98.5, pulse 89, blood pressure 217/93. respiratory rate of 18, oxygen saturation 100% on 2 liters of oxygen.  HEENT: Head normocephalic, atraumatic. Eyes: No scleral icterus. Conjunctivae normal. Pupils equal and react to light. Extraocular movements intact. Mucous membranes mild dryness. No pharyngeal erythema.  NECK: Supple. No  lymphadenopathy. No JVD. No carotid bruit. No thyromegaly.  CHEST: Has no focal tenderness.  LUNGS: Bilaterally clear to auscultation.  HEART: S1, S2. No murmurs are heard.  ABDOMEN: Mildly distended. Somewhat sluggish bowel sounds. Diffuse tenderness. Guarding and rebound tenderness mainly in the right upper quadrant. Could not appreciate hepatosplenomegaly secondary to the patient's severe pain.  EXTREMITIES: No pedal edema. Pulses 2+.  MUSCULOSKELETAL: Good range of motion in all of the extremities.  SKIN: No rash or lesions.  LYMPHATIC: No cervical or inguinal lymphadenopathy.  NEUROLOGIC: The patient is alert, oriented to place, person and time. Cranial nerves II through XII intact. Motor 5/5 in upper and lower extremities. No sensory deficits.   LABS: CMP: BUN 46, creatinine of 8.46. The rest of the values are within normal limits. Lipase is 153. Lactic acid 1.1.   CBC: WBC of 5.4, hemoglobin 11.2, platelet count of 169. PTT 34.   CT abdomen and pelvis showed:  1. Abnormal appearance of the duodenum and portions of the jejunum due to thickening of the bowel wall. No classic obstructive pattern. No free extraluminal gas collections are demonstrated. The colon is not clearly abnormal. The findings suggest enteritis, although possibly mesenteric ischemia could be pleasant.  2. The native kidneys as well as the transplanted kidney do not significantly enhance.   3. A small amount of radiodense material in the dependent portions of the gallbladder reflect stones. A small amount of atelectasis in the posterior costophrenic gutters bilaterally.    ASSESSMENT AND PLAN: The patient is a 64 year old male who comes to the Emergency Department with sudden onset of severe abdominal pain, nausea and vomiting.  1. Abdominal pain: Differential diagnosis enteritis versus mesenteric ischemia. The patient has a normal white blood cell count and normal lactic acid. The patient has been evaluated by surgery.  Admit the patient to the monitored bed. Continue with pain medications and antinausea medication. Concerning the patient's thickening of the bowel, will keep the patient on Zosyn.  2. Nausea, vomiting: Keep the patient n.p.o. and provide antinausea medications.  3. Hypertension: Uncontrolled. This could be secondary to severe pain. Continue with the labetalol. Will hold the p.o. medications for now.  4. End-stage renal disease, on hemodialysis: Will consult nephrology in the morning for hemodialysis. The patient is due for dialysis today.  5. Keep the patient on deep vein thrombosis prophylaxis with heparin.    TIME SPENT: 55 minutes.   ____________________________ Susa GriffinsPadmaja Salvatrice Morandi, MD pv:gb D: 05/16/2012 00:57:52 ET T: 05/16/2012 03:36:03 ET JOB#: [id number]  cc: Susa GriffinsPadmaja Nameer Summer, MD, <Dictator> Susa GriffinsPADMAJA Sanvi Ehler MD ELECTRONICALLY SIGNED 05/17/2012 6:34
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
## 2014-06-01 NOTE — Consult Note (Signed)
   Comments   Came by to see patient. He is currently in dialysis. Will return later.   Electronic Signatures: Borders, Daryl EasternJoshua R (NP)  (Signed 14-Apr-14 12:01)  Authored: Palliative Care   Last Updated: 14-Apr-14 12:01 by Malachy MoanBorders, Joshua R (NP)
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
## 2014-06-01 NOTE — Consult Note (Signed)
   Present Illness 64 yo male with history of esrd on hemodialysis, hypertension, cad s/p cabg in 2012 who was admitted after developing chest pain while on hemodialysis. He has had rather frequent admissions for this problem over the past few years. He is currently complaining of midsternal discomfort. HE has ruled out for an mi thus far. He states he has been compliant with his medicaitons. He has history of chronic afib and is on plavix for anitplatelet therapy.   Physical Exam:  GEN disheveled   HEENT PERRL, hearing intact to voice   NECK No masses   RESP no use of accessory muscles  rhonchi   CARD Irregular rate and rhythm  Murmur   Murmur Systolic   Systolic Murmur Out flow   ABD denies tenderness  normal BS   EXTR negative cyanosis/clubbing   SKIN normal to palpation   PSYCH anxious   Review of Systems:  Subjective/Chief Complaint chest pain   General: Fatigue  Weakness   Skin: No Complaints   ENT: No Complaints   Eyes: No Complaints   Neck: No Complaints   Respiratory: Short of breath   Cardiovascular: Chest pain or discomfort  Dyspnea   Gastrointestinal: Heartburn   Genitourinary: No Complaints   Vascular: No Complaints   Musculoskeletal: Muscle or joint pain   Neurologic: No Complaints   Hematologic: No Complaints   Endocrine: No Complaints   Psychiatric: Anxiety   Review of Systems: All other systems were reviewed and found to be negative   Medications/Allergies Reviewed Medications/Allergies reviewed   EKG:  Abnormal NSSTTW changes   Interpretation atrial fibrillation with no ischemia    Statins: Unknown   Impression Pt with history of cad s/p pci, esrd on hd, history of chornic afib admitted with recurrent chest pian while on dialysis. Has ruled out for an mi. Echo read per Dr. Mariah MillingGollan revealed preserved lv funciton with mild aortic valvular disease. LV function unchanged from previous. He is hemodynaically stable at presetn and ekg  does not show signficant ischemia.   Plan 1. Conitnue with current meds and rule out for mi. 2 Conitnue with hemodialysis  3. Would not proceed with invasive cardiac evaluation at present as pt has ruled out for mi and is hemodynamically stgable at present.  4. WIll follwo with you   Electronic Signatures: Dalia HeadingFath, Anhelica Fowers A (MD)  (Signed 03-Jun-14 14:17)  Authored: General Aspect/Present Illness, History and Physical Exam, Review of System, Home Medications, EKG , Allergies, Impression/Plan   Last Updated: 03-Jun-14 14:17 by Dalia HeadingFath, Shamaria Kavan A (MD)

## 2014-06-01 NOTE — Consult Note (Signed)
PATIENT NAME:  [name], [name] MR#:  [id number] DATE OF BIRTH:  1951/01/09  DATE OF CONSULTATION:  05/18/2012  CONSULTING PHYSICIAN:  Rodman Keyawn S. Yordy Matton, NP  ADDENDUM:    Having spoken with Dr. Lutricia FeilPaul Oh, the patient's creatinine level at this time as well as potassium needs to be corrected to a level which allows sedation to be administered safely. The patient is scheduled for dialysis tomorrow. As well as for consideration of mesenteric angiogram to be performed by Dr. Festus BarrenJason Dew. We will review laboratory results as well as diagnostic findings once available. According to Dr. Bluford Kaufmannh, if dialysis does correct electrolyte imbalances as well as improve results, as well as angiogram result being negative, consideration will be made for the patient to proceed forward with  upper endoscopy Friday of this week. We will continue to monitor the patient's status during hospitalization.   These services provided by Rodman Keyawn S. Aashi Derrington, NP under collaborative agreement with Lutricia FeilPaul Oh, MD.   ____________________________ Rodman Keyawn S. Ingrid Shifrin, NP dsh:cc D: 05/18/2012 16:10:00 ET T: 05/18/2012 18:00:27 ET JOB#: [id number]  cc: Rodman Keyawn S. Josey Dettmann, NP, <Dictator>  Rodman KeyAWN S Fumiye Lubben MD ELECTRONICALLY SIGNED 05/25/2012 16:12
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
## 2014-06-01 NOTE — Discharge Summary (Signed)
PATIENT NAME:  Russell [name], CROOKSHANKS MR#:  [id number] DATE OF BIRTH:  12/26/1950  DATE OF ADMISSION:  07/13/2012 DATE OF DISCHARGE:  07/14/2012  ADMITTING PHYSICIAN:  Dr. Heron Nay  DISCHARGING PHYSICIAN:  Dr. Enid Baas   PRIMARY CARE PHYSICIAN: [facility] nephrology.   CONSULTATIONS IN THE HOSPITAL:  1.  Nephrology consultation by Dr. Thedore Mins.  2.  Cardiology consultation with Dr. Lady Gary.  3.  Palliative care consultation by Dr. Harriett Sine Phifer.   DISCHARGE DIAGNOSES: 1.  Unstable angina with recurrent chest pain.  2.  Coronary artery disease status post bypass graft surgery and diffuse disease seen on a cardiac catheterization done in March 2014 and medical management recommended.  3.  Acute on chronic congestive heart failure with diastolic dysfunction.  4.  Malignant hypertension.  5.  End-stage renal disease on Monday, Wednesday, Friday hemodialysis.  6.  Anemia of chronic disease.  7.  Chronic obstructive pulmonary disease.  8.  Ongoing smoking.  9.  Diet-controlled diabetes mellitus.   DISCHARGE HOME MEDICATIONS:  1.  Fosrenol 1000 mg 2 tablets 3 times a day with meals.  2.  Rena-Vite with vitamin B complex and also vitamin C and folic acid 1 tablet p.o. daily.  3. Aspart insulin sliding scale insulin and to be taken 2 units for fingersticks 151 to 200, 4 units for sugars 201 to 250, 6 units for sugars 251 to 300 and 8 units for 301 to 350 and 10 units 351 to 400.     4.  Norco 5/325 mg 1 tablet q. 4 hours p.r.n. for pain.  5.  Losartan 100 mg p.o. daily.  6.  Imdur 30 mg p.o. b.i.d. 7.  Sublingual nitroglycerin every 5 minutes as needed for chest pain.  8.  Gabapentin 300 mg at bedtime.  9.  Plavix 75 mg daily.  10.  Metoprolol 25 mg p.o. b.i.d.  11.  Advair 250/50 1 puff b.i.d.  12.  Cardizem 120 mg p.o. daily.  13.  Hydralazine 25 mg p.o. 3 times a day.   DISCHARGE HOME OXYGEN:  2 liters.   DISCHARGE ACTIVITY: As tolerated.    FOLLOWUP INSTRUCTIONS: 1.  Follow up at [facility]  cardiology in 3 weeks.  2.  Nephrology follow-up tomorrow for dialysis, 07/15/2012.  3.  PCP follow-up in 2 weeks.   LABS AND IMAGING STUDIES PRIOR TO DISCHARGE: Sodium 137, potassium 4.4, chloride 99, bicarbonate 31 BUN 29, creatinine 6.1, glucose 70, and calcium 9.8. WBC 3.1, hemoglobin 10.6, hematocrit 32.6, platelet count is 148.   Echo Doppler showing normal LV systolic function, ejection fraction 50% to 55%, LV hypertrophy is seen and mild aortic wall stenosis is seen.   BRIEF HOSPITAL COURSE: Mr.  Kuenzi is a 64 year old African American male with past medical history significant for coronary artery disease status post bypass graft surgery, multivessel significant disease and a recent cardiac catheterization three months ago with no further intervention recommended at this time, end-stage renal disease on Monday, Wednesday, Friday hemodialysis, hypertension,  presented to the hospital secondary to chest pain. This is the patient's one of multiple hospitalizations for the same complaint.  1.  Unstable angina secondary to cardiac disease. He had a recent cardiac catheterization in March 2014, which showed graft disease and multiple vessel disease, not amenable for any intervention. Medical management was recommended. The patient was supposed to follow up at [facility] cardiology which he has not done in the last 2 years. His medications were adjusted. He is on Plavix. He is already on a  statin. Metoprolol was added to his medications and Imdur was also added to his medications. He was seen by Dr. Lady GaryFath from cardiology standpoint, and had no further recommendations at this point. Since the patient has become chest pain free, he is being discharged home. While in the hospital, the patient actually also [facility] medicine, Dr. Thana AtesMorrisey, who did not have anything else to offer after listening to the patient's history and recommended outpatient  follow-up which was appropriate.  2.  Malignant hypertension. The patient's blood pressure was initially difficult to be controlled, but got much better after dialysis. He is being discharged on losartan, Imdur, metoprolol and Cardizem and hydralazine.  3.  End-stage renal disease on hemodialysis. The patient presented with acute pulmonary edema, improved with hemodialysis and he will continue taking his Nephro-Vite vitamins.  4.  Anemia of chronic disease, appears stable.  5.  Chronic obstructive pulmonary disease is stable. Continue inhalers and advised strongly again smoking cessation.  6.  The patient usually walks with a walker at baseline, and will be discharged home. Refuse any home health.   DISCHARGE DISPOSITION: Home.   DISCHARGE CONDITION: Guarded with poor long-term prognosis.   CODE STATUS: DO NOT RESUSCITATE as palliative care has discussed during this admission his previous admissions.   TIME SPENT ON DISCHARGE: 45 minutes   ____________________________ Enid Baasadhika Grayland Daisey, MD rk:cc D: 07/14/2012 16:50:07 ET T: 07/14/2012 21:36:27 ET JOB#: [id number]  cc: Enid Baasadhika Zyir Gassert, MD, <Dictator> Enid BaasADHIKA Blakleigh Straw MD ELECTRONICALLY SIGNED 07/18/2012 15:33

## 2014-06-01 NOTE — Consult Note (Signed)
   Comments   I had a very lengthy discussion with pt, his son and daughter-in-law. I gave pt the option of stopping dialysis but he says that he doesn't mind dialysis and wants to continue. Pt feels that he is not getting the care he needs at this hospital and [facility]. [facility] and cardiologist and he has been a pt there in the past. I spoke with Dr Nemiah CommanderKalisetti and she will [facility] pt.   Electronic Signatures: Chanta Bauers, Harriett SineNancy (MD)  (Signed 03-Jun-14 17:08)  Authored: Palliative Care   Last Updated: 03-Jun-14 17:08 by Kenyata Guess, Harriett SineNancy (MD)

## 2014-06-01 NOTE — Consult Note (Signed)
C: abd pain, had a stent placed in celiac artery on Friday.  Complains of pain in right arm but no palpable fullness, good radial pulse, no abd pain at this time.  Wbc 4.2, hgb 10, plt 115.  No new recommendations.  Electronic Signatures: Scot JunElliott, Fiorella Hanahan T (MD)  (Signed on 13-Apr-14 10:00)  Authored  Last Updated: 13-Apr-14 10:00 by Scot JunElliott, Asaad Gulley T (MD)

## 2014-06-01 NOTE — Consult Note (Signed)
CC: GI bleed from coumadin.  Pt asleep, chest with fair air flow, WBC 14.4,  hgb 8, plt 176, per nurse ate all of full liquid breakfast.  PICC line in. No new suggestions.   Electronic Signatures: Scot JunElliott, Robert T (MD) (Signed on 12-Apr-14 12:14)  Authored   Last Updated: 12-Apr-14 12:20 by Scot JunElliott, Robert T (MD)

## 2014-06-01 NOTE — Consult Note (Signed)
Discussed case with Dr.Schnier. Mesenteric angiogram will be done based on EGD findings. With recent MI, patient at high risk for complications. Will plan EGD this AM as an emergency case. Discussed with anesthesia also.   Electronic Signatures: Lutricia Feilh, Kardell Virgil (MD) (Signed on 11-Apr-14 11:00)  Authored   Last Updated: 11-Apr-14 11:02 by Lutricia Feilh, Shey Bartmess (MD)

## 2014-06-01 NOTE — Discharge Summary (Signed)
PATIENT NAME:  Russell [name], Russell [name] MR#:  [id number] DATE OF BIRTH:  1950-08-29  DATE OF ADMISSION:  04/01/2012 DATE OF DISCHARGE:  04/06/2012  ADMITTING PHYSICIAN: Srikar R. Sudini, MD  DISCHARGING PHYSICIAN: Enid Baas, MD  PRIMARY CARE PHYSICIAN: None.   CONSULTATIONS IN THE HOSPITAL:  1.  Nephrology consultation by  Mosetta Pigeon, MD  2.  Pulmonary critical care consultation by Dory Larsen, MD  DISCHARGE DIAGNOSES: 1.  Acute respiratory failure, secondary to pulmonary edema.  2.  Chronic obstructive pulmonary disease exacerbation.  3.  Right lower lobe pneumonia.  4.  Acute on chronic diastolic congestive heart failure.  5.  Dietary noncompliance.  6.  End-stage renal disease on Monday, Wednesday, Friday hemodialysis.  7.  Diabetes mellitus.  8.  Anemia of chronic disease.   DISCHARGE HOME MEDICATIONS:  1.  Claritin 10 mg p.o. daily.  2.  Nephro-Vite 1 tablet p.o. daily.  3.  Aspirin 81 mg p.o. daily.  4.  Colace 100 mg p.o. daily.  5.  Senna 2 tablets daily.  6.  Advair 250/50 3 mL of 4 times a day.  7.  Allegra 25 mg every 6 hours p.r.n. for allergies 4 times a day.  8.  Fosrenol 1000 mg 1 tablet twice a day.  9.  Tylenol 500 mg q. 4 hours p.r.n. for pain.  10.  Allegra  25 mg 2 tablets q. 6 hours as needed for itching.  11.  Prednisone taper. 12.  Norco 325/5 mg 1 tablet every 4 hours as needed for pain.  13.  Losartan 100 mg p.o. q. daily.  14.  Isosorbide mononitrate 60 mg p.o. daily.  15.  Nitroglycerin 0.4 mg sublingual p.r.n. for chest pain every 5 minutes.  16.  Amiodarone 400 mg p.o. daily.  17.  Plavix 75 mg p.o. daily.  18.  Metoprolol 25 mg p.o. b.i.d.  19.  Theophylline 100 mg p.o. daily.  20.  Spiriva 18 mcg inhalation daily.  21.  Amlodipine 10 mg p.o. daily.  22.  Renagel 800 mg 2 tablets 3 times a day.  23.  Augmentin 500 mg every day for 7 days.   DISCHARGE HOME OXYGEN: 2 liters.   DISCHARGE DIET: Low-sodium, renal diet.   DISCHARGE  ACTIVITY: As tolerated.    FOLLOWUP INSTRUCTIONS: Follow up for dialysis as scheduled for this Friday 04/08/2012 and PCP follow-up in 1 to 2 weeks.   LABS AND IMAGING STUDIES: Hepatitis surface antigen negative. Phophorus 5.2.   Chest x-ray showing worsening interstitial markings on the right side, progressive interstitial edema and alveolar filling. This is from 04/03/2012.   Sodium 138, potassium 5.1, chloride 101, bicarbonate 28, BUN 49, creatinine 7.4, glucose 169, calcium 9.8. WBC 5.1, hemoglobin 9, hematocrit 29.8, platelet count 173. Blood cultures are negative. Urinalysis negative for any infection.   BRIEF HOSPITAL COURSE: The patient is a 64 year old African American male with known history of end-stage renal disease, diastolic congestive heart failure and chronic obstructive pulmonary disease and coronary artery disease. He was noncompliant with his diet comes to the hospital secondary to shortness of breath and found to be in pulmonary edema.  1. Acute respiratory failure secondary to pulmonary edema. He was started on BiPAP on admission, has remained on oxygen this admission and is requiring 2 liters at the time of discharge.  His saturations improved after he was dialyzed emergently after admission. He is very noncompliant with diet and not sure about his medications, so this time he is being discharged  home with home health.  2.  Acute on chronic congestive heart failure exacerbation and diastolic dysfunction, again secondary to dietary noncompliance.  Management as above. He is being discharged on aspirin, losartan, Imdur and metoprolol.   3.  Chronic obstructive pulmonary disease exacerbation. He is on inhalers, nebs p.r.n. and prednisone taper at this time.  4.  Right lower lobe pneumonia. He is started on that started on Zosyn and Levaquin while in the hospital, being discharged on Augmentin.  5.  Anemia of chronic disease. He will get Procrit during dialysis.   His course has  been otherwise uneventful in the hospital.   DISCHARGE CONDITION: Stable.   DISCHARGE DISPOSITION: Home with home health.   TIME SPENT ON DISCHARGE: 45 minutes     ____________________________ Enid Baasadhika Aideliz Garmany, MD rk:cc D: 04/06/2012 16:19:03 ET T: 04/06/2012 18:04:53 ET JOB#: [id number]  cc: Enid Baasadhika Zubin Pontillo, MD, <Dictator> Enid BaasADHIKA Isaiah Cianci MD ELECTRONICALLY SIGNED 04/13/2012 13:36

## 2014-06-01 NOTE — H&P (Signed)
DATE OF BIRTH:  1950-11-29  DATE OF ADMISSION:  04/18/2012  PRIMARY CARE PHYSICIAN:  None.   The patient goes to [facility] for dialysis.    CHIEF COMPLAINT: Shortness of breath.   HISTORY OF PRESENT ILLNESS: Russell [name] is a 64 year old African-American gentleman with past medical history of end-stage renal disease, coronary artery disease, status post CABG and PCI in the past, last heart cath was done in December 2013, history of chronic AFib on Coumadin, comes in with significant shortness of breath for the last couple of days. Apparently patient missed 3 dialysis sessions last week, and started having shortness of breath along with increased swelling of both his legs. The patient is unable to give any history or review of systems at this time. He is very lethargic from his hypoxia and shortness of breath. He is currently on BiPAP. His pulse 69 to 70, blood pressure is 157/63, sats are 97% on current BiPAP setting. No family members are present in the Emergency Room. It was noted that patient has a potassium of 7.2 and a troponin of 5.6, with elevated CK and CKMB. He is being admitted for acute on chronic hypoxic respiratory failure secondary to congestive heart failure, acute on chronic diastolic, with acute non-Q-wave MI.    PAST MEDICAL HISTORY: 1.  Coronary artery disease, status post CABG, with cardiac catheterization and PCI last done in September 2013.  2.  End-stage renal disease, on hemodialysis. Follows with [facility].  3.  Hypertension.  4.  Atrial fibrillation, on Coumadin.  5.  Diastolic congestive heart failure.  6.  Chronic respiratory failure secondary to COPD and diastolic heart failure, on home oxygen.  7.  Hyperlipidemia.  8.  Medical noncompliance.   PAST SURGICAL HISTORY:  Appendectomy.   SOCIAL HISTORY:  He continues to smoke, about 1/2 pack per day. No alcohol. No illicit drug use.    PAST SURGICAL HISTORY: Appendectomy. Failed kidney transplant. Right  eye cataract surgery.   FAMILY HISTORY:  Mother had chronic kidney disease and hypertension.   CURRENT HOME MEDICATIONS: (brought in by EMS)  1.  Warfarin 6 mg daily.  2. renavite p.o. daily. 3.  Nitro Stat 0.4 mg p.r.n.  4.  Losartan 100 mg daily.  5.  Isosorbide mononitrate 60 mg extended release p.o. daily.  6.  Hydralazine 50 mg b.i.d.  7.  Gabapentin 100 mg 3 capsules 3 times a day.  8.  Fosrenol 1000 mg 2 tablets 3 times a day.  9.  Acetaminophen/hydrocodone 5/325, 1 tablet every 4 hours as needed.   ALLERGIES:  STATINS.   REVIEW OF SYSTEMS:  Unobtainable, patient currently lethargic on BiPAP.   PHYSICAL EXAMINATION: GENERAL: Limited exam secondary to patient's participation, given lethargy. The patient appears critically ill on BiPAP.  VITAL SIGNS:  He is afebrile, pulse is 69 to 70, blood pressure is 157/69, sats are 97% on current BiPAP setting.  HEENT: Atraumatic, normocephalic. PERLA, EOM intact. Oral mucosa is moist.  NECK:  Supple.  JVD present.  RESPIRATORY:  Respiratory distress, with use of accessory muscles, and bilateral crackles heard up to the mid lung. No rhonchi.  CARDIOVASCULAR: Both the heart sounds are normal. No murmur heard. PMI not lateralized.  EXTREMITIES:  Feeble pedal pulses, not felt much secondary to edema. He has 3+ pitting edema up to the knee joint. Good femoral pulses.  ABDOMEN:  Soft, benign. No organomegaly noted.  NEUROLOGIC: Unable to assess; however, appears nonfocal. Patient does open eyes on verbal commands, but  not able to communicate much secondary to his hypoxemia and shortness of breath.  SKIN:  Warm and dry.   LABORATORY DATA:  Troponin is 5.63. CK total 450, CPKMB is 27.7. Glucose is 87, BUN is 91, creatinine 12.97, potassium is 7.2, chloride 108, bicarb is 17. Calcium is 7.5. EKG shows peaked T waves with wide QRS complex, sinus tachycardia with right bundle branch block.   ASSESSMENT:  A 64 year old African-American gentleman with  history of end-stage renal disease, on hemodialysis, history of diastolic congestive heart failure, chronic atrial fibrillation, chronic obstructive pulmonary disease, with ongoing tobacco abuse, comes to the hospital with increasing shortness of breath, and missed 3 outpatient hemodialysis sessions last week. He is being admitted with:   1.  Acute on chronic hypoxic respiratory failure due to flash pulmonary edema and congestive heart failure, acute on chronic diastolic, likely due to missing 3 hemodialysis sessions last week, with significant volume overload. The patient was seen by Dr. Candiss Norse in the Emergency Room. He will be dialyzed urgently tonight. He is going to be admitted to the Intensive Care Unit, n.p.o., continue BiPAP. The patient received a dose of Lasix 100 mg in the Emergency Room. I will follow up. Wean oxygen as charted; however, tonight will keep patient on BiPAP for now.   2.  Acute non-ST myocardial infarction, with elevated troponin, CPK and CPKMB. The patient has history of significant coronary artery disease, with PCI in September 2013. He does have significant 3-vessel disease, with CABG in the past. Will start patient currently on heparin drip, give him nitro after dialysis, per rectal aspirin x 1, and IV metoprolol after dialysis. Dr. Ubaldo Glassing was informed of patient's admission.check  cardiac enzymes x 3. He is allergic to STATINS, I will hold off on it for now.   3.  Acute hypokalemia, with EKG changes. The patient received calcium gluconate, dextrose with insulin, and will be dialyzed urgently. Will follow up MET B closely.   4.  End-stage renal disease. Dr. Candiss Norse saw patient in the Emergency Room. He will be dialyzed tonight, tomorrow, and then will be put on his routine dialysis schedule as per patient's need.   5.  Anemia of chronic disease.   6.  Type 2 diabetes. Initially, patient was hypoglycemic, received about 1/2 amp of D50 with EMS. His sugars are stable now. Will  continue sliding scale insulin for now.   7.  I did discuss with patient's son, Russell, [name]. (his phone number is [phone]) over the phone, critical nature of illness was explained to the son.     CODE STATUS: The patient has been followed in the past by Dr. Ermalinda Memos from [place],  and last 2 admissions, patient has been a NO CODE, DNR. Hence, I will continue the code status to be NO CODE, DNR at this time.   CRITICAL TIME SPENT:  60 minutes.    ____________________________ Russell Rochester Posey Pronto, MD sap:mr D: 04/18/2012 18:47:06 ET T: 04/18/2012 19:34:43 ET JOB#: [id number]  cc: Sona A. Posey Pronto, MD, <Dictator> [facility] Efraim Kaufmann, MD   Ilda Basset MD ELECTRONICALLY SIGNED 04/21/2012 15:11

## 2014-06-01 NOTE — Consult Note (Signed)
Pt seen and examined. Please see Dawn Harrison's notes. Vague but diffuse abd pain. S/P MI. CRF on HD. Done yest and to be repeated tomorrow? Will consider EGD on Fri only if K/Cr improves and mesenteric angiogram is neg. Pt is at relatively high risk for EGD. Will follow. Thanks.  Electronic Signatures: Lutricia Feilh, Hayly Litsey (MD)  (Signed on 09-Apr-14 16:12)  Authored  Last Updated: 09-Apr-14 16:12 by Lutricia Feilh, Kynsley Whitehouse (MD)

## 2014-06-01 NOTE — Consult Note (Signed)
General Aspect 64 yo male with history of cad , esrd on hemodialysis who was admitted after missing several days of dialysis due to weather and transportation issues. He was hyperkalemic with an elevated serum troponin on admission. He was lethargic on admission . Sensorium has improved after hemodialysis. He currently complains of dizziness. Denies chest pain at present. Has frequent admissions with chest pain and shortness of breath. EKG reveals afib with variable response   Physical Exam:  GEN disheveled   HEENT PERRL   NECK supple   RESP no use of accessory muscles  rhonchi   CARD Irregular rate and rhythm  Murmur   Murmur Systolic   Systolic Murmur axilla   ABD denies tenderness  normal BS   LYMPH negative neck   EXTR negative cyanosis/clubbing   SKIN normal to palpation   NEURO cranial nerves intact, motor/sensory function intact   PSYCH poor insight   Review of Systems:  Subjective/Chief Complaint chest pain and dizziness   General: Fatigue  Weakness   Skin: No Complaints   ENT: No Complaints   Eyes: No Complaints   Neck: No Complaints   Respiratory: Short of breath   Cardiovascular: Chest pain or discomfort  Tightness   Gastrointestinal: No Complaints   Genitourinary: No Complaints   Vascular: No Complaints   Musculoskeletal: No Complaints   Neurologic: Dizzness   Hematologic: No Complaints   Endocrine: No Complaints   Psychiatric: Nervousness   Review of Systems: All other systems were reviewed and found to be negative   Medications/Allergies Reviewed Medications/Allergies reviewed     Hyperlipidemia:    CAD:    ESRD:    CHF:    Atrial Fibrillation:    Bronchitis:    MI - Myocardial Infarct:    pneumonia:    htn:    dialysis:    Stent placement 11/05/2011:    Appendectomy:    CABG (Coronary Artery Bypass Graft):   Home Medications: Medication Instructions Status  Advair Diskus 250 mcg-50 mcg inhalation powder 1  puff(s) inhaled 2 times a day Active  Fosrenol 1000 mg oral tablet, chewable 2 tab(s) orally 3 times a day (with meals) Active  gabapentin 100 mg oral capsule 3 cap(s) orally 3 times a day Active  hydrALAZINE 50 mg oral tablet 1 tab(s) orally 2 times a day Active  diltiazem 120 mg/24 hours oral capsule, extended release 1 cap(s) orally once a day Active  acetaminophen-hydrocodone 325 mg-5 mg oral tablet 1 tab(s) orally every 4 hours, As Needed- for Pain  Active  warfarin 6 mg oral tablet 1 tab(s) orally once a day Active  clopidogrel 75 mg oral tablet 1 tab(s) orally once a day Active  Nitrostat 0.4 mg sublingual tablet 1 tab(s) sublingual every 5 minutes, As Needed- for Chest Pain  Active  amlodipine 10 mg oral tablet 1 tab(s) orally once a day Active  Rena-Vite Rx Vitamin B Complex with C and Folic Acid oral tablet 1 tab(s) orally once a day Active  losartan 100 mg oral tablet 1 tab(s) orally once a day Active  isosorbide mononitrate 60 mg oral tablet, extended release 1 tab(s) orally once a day Active   EKG:  Abnormal NSSTTW changes   Interpretation afib    Statins: Unknown   Impression PT with history of cad s/p cabg and pci in 9/13, history of esrd on hemodialysis which was missed for several days due to weather and transportation issues. He was treated with urgent hemodialysis with some improvement. He  had an eelvated serum troponin at 6.0 with elevated cpk/mb. EKG reveals afib with variable vr with non speicific st t wave chagnes. Elevated serum troponin etioilogy is unclear. Certainly progression of cad is likely given histgory and compliance issues. This is likely exacerbated due to missing dialysis and renal failure. Pt is stable at present. Consideration ofr relook cardiac cath may be appropriate. Has ruled in for a nstemi.   Plan 1. Continue current meds. 2.Continue with hemodialysis. 3. Will discuss consideration for cardiac cath with nephrology to coordinate timing with  hemodialysis 4 Proceed with cath to evaluate for progression of disease today or in am after discussion with neprology   Electronic Signatures: Dalia Heading (MD)  (Signed 12-Mar-14 09:06)  Authored: General Aspect/Present Illness, History and Physical Exam, Review of System, Past Medical History, Home Medications, EKG , Allergies, Impression/Plan   Last Updated: 12-Mar-14 09:06 by Dalia Heading (MD)

## 2014-06-01 NOTE — Consult Note (Signed)
Referring Physician:  Monica Becton :   Primary Care Physician:  Monica Becton [facility], [address] [place], [place] [address], [place] [phone]  Reason for Consult: Admit Date: 15-May-2012  Chief Complaint: R arm pain and decreased movement  Reason for Consult: R arm weakness   History of Present Illness: History of Present Illness:   64 yo RHD male presents to hospital for leg pain and ended up having a cathaterization through his R arm.  After this, pt notes pain in his R arm and decreased movement.  He notes that his first three fingers feel numb.  Pain is mainly in the upper arm.  No weakness or numbness in his face or legs.  ROS:  General pain    HEENT no complaints    Lungs no complaints    Cardiac no complaints    GI no complaints    GU no complaints    Musculoskeletal joint pain  joint swelling    Extremities no complaints    Skin no complaints    Neuro numbness/tingling    Endocrine no complaints    Psych no complaints    Past Medical/Surgical Hx:  Hyperlipidemia:   CAD:   ESRD:   CHF:   Atrial Fibrillation:   Bronchitis:   MI - Myocardial Infarct:   pneumonia:   htn:   dialysis:   Stent placement 11/05/2011:   Appendectomy:   CABG (Coronary Artery Bypass Graft):   Past Medical/ Surgical Hx:  Past Medical History as above   Past Surgical History as above   Home Medications: Medication Instructions Last Modified Date/Time  Advair Diskus 250 mcg-50 mcg inhalation powder 1 puff(s) inhaled 2 times a day 06-Apr-14 20:32  Fosrenol 1000 mg oral tablet, chewable 2 tab(s) orally 3 times a day (with meals) 06-Apr-14 20:32  gabapentin 100 mg oral capsule 3 cap(s) orally 3 times a day 06-Apr-14 20:32  hydrALAZINE 50 mg oral tablet 1 tab(s) orally 2 times a day 06-Apr-14 20:32  diltiazem 120 mg/24 hours oral capsule, extended release 1 cap(s) orally once a day 06-Apr-14 20:32  acetaminophen-hydrocodone 325 mg-5 mg oral  tablet 1 tab(s) orally every 4 hours, As Needed- for Pain  06-Apr-14 20:32  warfarin 6 mg oral tablet 1 tab(s) orally once a day 06-Apr-14 20:32  clopidogrel 75 mg oral tablet 1 tab(s) orally once a day 06-Apr-14 20:32  Nitrostat 0.4 mg sublingual tablet 1 tab(s) sublingual every 5 minutes, As Needed- for Chest Pain  06-Apr-14 20:32  amlodipine 10 mg oral tablet 1 tab(s) orally once a day 06-Apr-14 20:32  Rena-Vite Rx Vitamin B Complex with C and Folic Acid oral tablet 1 tab(s) orally once a day 06-Apr-14 20:32  losartan 100 mg oral tablet 1 tab(s) orally once a day 06-Apr-14 20:32  isosorbide mononitrate 60 mg oral tablet, extended release 1 tab(s) orally once a day 06-Apr-14 20:32   Allergies:  Statins: Unknown  Social/Family History: Employment Status: retired  Lives With: alone  Living Arrangements: house  Social History: denies tob, EtOH and illicits  Family History: n/c   Vital Signs:  **Vital Signs.:   15-Apr-14 20:49  Vital Signs Type POCT  Nurse Fingerstick (mg/dL) FSBS (fasting range 65-99 mg/dL) 87  Comments/Interventions  Nurse Notified    22:06  Vital Signs Type Routine  Temperature Temperature (F) 98.8  Celsius 37.1  Temperature Source oral  Pulse Pulse 96  Respirations Respirations 19  Systolic BP Systolic BP 80  Diastolic BP (mmHg) Diastolic BP (mmHg)  57  Mean BP 64  Pulse Ox % Pulse Ox % 94  Pulse Ox Activity Level  At rest  Oxygen Delivery 2L   Physical Exam: General: appears older than stated age, thin, mild distress  HEENT: normocephalic, sclera nonicteric, oropharynx clear  Neck: supple, no JVD, no bruits  Chest: CTA B, no wheezing, good movement  Cardiac: RRR, no murmurs, no edema, 2+ pulses  Extremities: limited ROM in R UE due to pain, mild edema in R UE to pain as well   Neurologic Exam: Mental Status: alert and oriented x 3, normal speech and language, follows complex commands  Cranial Nerves: PERRLA, EOMI, nl VF, face symmetric, tongue  midline, shoulder shrug equal  Motor Exam: 5/5 B except limited in R UE due to pain, weakness along R first three fingers, no atrophy  Deep Tendon Reflexes: 2+/4 B, plantars downgoing B, no Hoffman  Sensory Exam: decreased pinprick, along R median nerve   Lab Results:  Hepatic:  06-Apr-14 20:19   Bilirubin, Total 0.6  Alkaline Phosphatase 106  SGPT (ALT) 18  SGOT (AST) 24  Total Protein, Serum 7.5  14-Apr-14 11:09   Albumin, Serum  2.7  General Ref:  08-Apr-14 10:05   HBsAg ========== TEST NAME ==========  ========= RESULTS =========  = REFERENCE RANGE =  HEPATITIS B SURFACE AG  HBsAg Screen HBsAg Screen                    [   Negative             ]          Negative               [facility]            No: [id number]           [address], [place], [place] [address]           Lindon Romp, MD         [phone]   Result(s) reported on 18 May 2012 at 08:18AM.  Hepatitis B Surface Antibody, Qual ========== TEST NAME ==========  ========= RESULTS =========  = REFERENCE RANGE =  HEPATITIS B SURF.AB,QUAL  Hep B Surface Ab Hep B Surface Ab, Qual          [   Non Reactive         ]                                                Non Reactive: Inconsistent with immunity,                                            less than 10 mIU/mL                              Reactive:     Consistent with immunity,                                            greater than 9.9 mIU/mL  [facility]            No: [id number]           [address] [place], [place] [address]           Lindon Romp, MD         [phone]   Result(s) reported on 18 May 2012 at 08:18AM.  Routine Chem:  06-Apr-14 20:19   Lipase 153 (Result(s) reported on 15 May 2012 at 08:38PM.)  08-Apr-14 05:05   LDH, Serum 191 (Result(s) reported on 17 May 2012 at 10:23AM.)  Magnesium, Serum 2.2 (1.8-2.4 THERAPEUTIC RANGE: 4-7 mg/dL TOXIC: > 10 mg/dL   -----------------------)  10-Apr-14 05:05   eGFR (African American)  6  eGFR (Non-African American)  6 (eGFR values <48m/min/1.73 m2 may be an indication of chronic kidney disease (CKD). Calculated eGFR is useful in patients with stable renal function. The eGFR calculation will not be reliable in acutely ill patients when serum creatinine is changing rapidly. It is not useful in  patients on dialysis. The eGFR calculation may not be applicable to patients at the low and high extremes of body sizes, pregnant women, and vegetarians.)  12-Apr-14 10:13   Result Comment troponin - RESULTS VERIFIED BY REPEAT TESTING.  Result(s) reported on 21 May 2012 at 12:26PM.  14-Apr-14 11:09   Glucose, Serum  105  BUN  27  Creatinine (comp)  8.55  Sodium, Serum 136  Potassium, Serum 3.9  Chloride, Serum  96  CO2, Serum 31  Calcium (Total), Serum 9.6  Phosphorus, Serum  7.7  Anion Gap 9  Osmolality (calc) 277 (Result(s) reported on 23 May 2012 at 11:54AM.)  Cardiac:  11-Apr-14 21:47   CK, Total 42 (Result(s) reported on 20 May 2012 at 10:30PM.)  12-Apr-14 10:13   Troponin I 0.05 (0.00-0.05 0.05 ng/mL or less: NEGATIVE  Repeat testing in 3-6 hrs  if clinically indicated. >0.05 ng/mL: POTENTIAL  MYOCARDIAL INJURY. Repeat  testing in 3-6 hrs if  clinically indicated. NOTE: An increase or decrease  of 30% or more on serial  testing suggests a  clinically important change)  Routine UA:  07-Apr-14 13:00   Color (UA) Yellow  Clarity (UA) Clear  Glucose (UA) 150 mg/dL  Bilirubin (UA) Negative  Ketones (UA) Negative  Specific Gravity (UA) 1.014  Blood (UA) Negative  pH (UA) 8.0  Protein (UA) 100 mg/dL  Nitrite (UA) Negative  Leukocyte Esterase (UA) Negative (Result(s) reported on 16 May 2012 at 01:23PM.)  RBC (UA) 2 /HPF  WBC (UA) 2 /HPF  Bacteria (UA) TRACE  Epithelial Cells (UA) 3 /HPF  Transitional Epithelial (UA) <1 /HPF (Result(s) reported on 16 May 2012 at 01:23PM.)  Routine  Hem:  14-Apr-14 11:09   WBC (CBC) 5.9  RBC (CBC)  3.20  Hemoglobin (CBC)  9.8  Hematocrit (CBC)  30.5  Platelet Count (CBC)  112  MCV 96  MCH 30.7  MCHC 32.2  RDW  16.9  Neutrophil % 87.3  Lymphocyte % 2.3  Monocyte % 9.1  Eosinophil % 1.1  Basophil % 0.2  Neutrophil # 5.1  Lymphocyte #  0.1  Monocyte # 0.5  Eosinophil # 0.1  Basophil # 0.0 (Result(s) reported on 23 May 2012 at 12:01PM.)   Radiology Impression: Radiology Impression: MRI of arm personally reviewed by me and shows a R brachial pseudoaneurysm   Impression/Recommendations: Recommendations:   prior notes reviewed by me  reviewed by me   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  This usually has a reasonable prognosis for recovery. Prednisone 34m x 3 days baclofin 136mTID for spasms needs vascular repair will follow  Electronic Signatures: SmJamison NeighborMD)  (Signed 16-Apr-14 15:23)  Authored: REFERRING PHYSICIAN, Primary Care Physician, Consult, History of Present Illness, Review of Systems, PAST MEDICAL/SURGICAL HISTORY, HOME MEDICATIONS, ALLERGIES, Social/Family History, NURSING VITAL SIGNS, Physical Exam-, LAB RESULTS, RADIOLOGY RESULTS, Recommendations   Last Updated: 16-Apr-14 15:23 by SmJamison NeighborMD)
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
## 2014-06-01 NOTE — Discharge Summary (Signed)
PATIENT NAME:  Russell [name], Russell [name] MR#:  [id number] DATE OF BIRTH:  1950-05-21  DATE OF ADMISSION:  05/16/2012 DATE OF DISCHARGE:  05/30/2012  Please refer to the already dictated interim summary of 05/26/2012. This will cover April 19th through the 21st of April 2014.  The patient was discharged on 05/30/2012.  ADMISSION DIAGNOSIS:  Abdominal pain and vomiting.  DISCHARGE DIAGNOSES:  1.  Abdominal pain with nausea and vomiting due to atherosclerotic disease of the abdominal vasculature.  2.  Chest pain.  3.  History of chronic diastolic heart failure. 4.  History of chronic obstructive pulmonary disease. 5.  End-stage renal disease, on hemodialysis.  6.  Anemia of end-stage renal disease.  7.  Diabetes. 8.  Right brachial artery pseudoaneurysm.  9.  Lethargy and weakness.  10.  Itching.   HOSPITAL COURSE: The patient is a 64 year old male who came in with abdominal pain. For further details, please refer to the H and P as well as already dictated interim summary dictated by Dr. Jacques Navy on April 17th.   1.  Abdominal pain with nausea and vomiting due to atherosclerotic disease of the abdominal vasculature status post celiac PTA on April 11th by vascular.  The patient is doing well from this standpoint. He had 7 days of Zosyn for colitis. EGD showed hiatal hernia. He is tolerating his diet without any abdominal pain.  2.  Chest pain.  Postprocedure his enzymes were negative, chest pain-free.  He was seen by cardiology in the past. He has a history of CAD with PCI in December of 2013. He was ruled out for acute coronary syndrome with negative troponins.  3.  History of chronic diastolic heart failure, which is stable.  4.  History of COPD, which was stable. 5.  Endstage renal disease, on hemodialysis. The patient was on Monday, Wednesday and Friday dialysis here while hospitalized.  6.  Anemia of end-stage renal disease.  Hemoglobin has been stable. 7.  Diabetes. His blood sugars were  controlled.  8.  Right brachial artery pseudoaneurysm. The patient was complaining of right arm pain. A MRI was done which showed a right brachial artery pseudoaneurysm. He underwent a thrombin injection by vascular surgery.  He was on prednisone as well, per neurology input, and he will be scheduled to see neurology for EMG if the pain continues.  9.  Lethargy and weakness after his right brachial artery pseudoaneurysm, due to medications, which has improved.   DISCHARGE MEDICATIONS: 1.  Losartan 100 mg daily.  2.  Fosrenol 1000 mg 2 tablets t.i.d.  3.  Gabapentin 100 mg 3 capsules t.i.d.  4.  Diltiazem 120 mg daily.  5.  Plavix 75 mg daily.  6.  Rena-Vite 1 tablet daily.  7.  Tylenol/oxycodone 325/5 q. 4 hours p.r.n. pain.  8.  Sliding scale insulin.   DISCHARGE DRESSING: Bilateral heels:  Cleanse with SAF-Clens.  Pat dry.  Apply foam with border.    DISCHARGE OXYGEN:  2 liters nasal cannula.   DISCHARGE DIET: ADA.   DISCHARGE ACTIVITY: As tolerated.   DISCHARGE FOLLOWUP: The patient will resume hemodialysis Monday, Wednesday and Friday at his regular schedule. He will follow up with Dr. Gilda Crease in 1 to 2 weeks and Dr. [facility] neurology in 2 weeks.  TIME SPENT: Approximately 35 minutes. ____________________________ Janyth Contes. Juliene Pina, MD spm:sb D: 06/03/2012 10:42:05 ET T: 06/03/2012 11:16:05 ET JOB#: [id number]  cc: Charlize Hathaway P. Juliene Pina, MD, <Dictator> Renford Dills, MD [facility] Aila Terra P Chyanna Flock MD ELECTRONICALLY  SIGNED 06/11/2012 13:24

## 2014-06-03 NOTE — H&P (Signed)
PATIENT NAME:  Russell [name], [name] MR#:  [id number] DATE OF BIRTH:  [birth year]  DATE OF ADMISSION:  06/10/2011   PRIMARY CARE PHYSICIAN: None CARDIOLOGIST: Dr. Gwen PoundsKowalski  DIALYSIS: The patient will see Dr. Mikael SprayKaiser.   CHIEF COMPLAINT: Chest pain, near syncope.   HISTORY OF PRESENT ILLNESS: The patient is a 64 year old male with [facility] of coronary artery disease and [facility], ESRD,  history of hypertension, chronic obstructive pulmonary disease, and chronic atrial fibrillation who was discharged recently on 04/27 after hospitalization. He came in today because he had chest pain on the left side, around 9/10 in severity, radiating to the neck and shoulder.  The patient said that the chest pain was relieved with nitroglycerin given in the ER.  He did not have any cough or fever but did vomit twice in the Emergency Room.  He denies any dizziness.  The chest pain is worse when he walks.  All this started after dialysis.  The patient said he felt like he was going to pass out but did not really pass out.  No headache, no dizziness, no weakness, no seizure activity.  PAST MEDICAL HISTORY:  1. History of endstage renal disease, on hemodialysis for the last ten years.  2. 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  3. Hypertension.  4. Chronic obstructive pulmonary disease.  5. Chronic atrial fibrillation. 6. History of coronary artery disease with [facility].  ALLERGIES: Statins.  PAST SURGICAL HISTORY: 1. Bypass surgery in 2010.  2. History of cataract surgery.   SOCIAL HISTORY: Still smokes about a pack per day. No alcohol. No drugs.    MEDICATIONS: 1. Aspirin 81 mg daily.  2. Felodipine 10 mg daily.  3. Combivent 1 puff every six hours as needed. 4. Metoprolol 25 mg b.i.d.  5. Advair 250/50, 1 puff b.i.d.  6. Nexium 40 mg b.i.d. 7. Amitriptyline 100 mg daily.  8. Nephro-Vite 1 tablet daily.  9. Synacort 1 tablet daily.  10. Colace  100 mg daily.  11. Neurontin 300 mg p.o. t.i.d.  12. Amiodarone 400 mg p.o. b.i.d.  13. Lidoderm patch, apply 12 hours then off 12 hours to the right shoulder. 14. Hydralazine 50 mg p.o. t.i.d.  15. Coumadin 6 mg daily.  16. Levaquin, started recently during the last discharge for pneumonia. He needs to take it every other day for five doses. The patient still has to take one more day.  17. Norco 5/325, 1 to 2 tablets q. 6 hours p.r.n.   FAMILY HISTORY: Noncontributory.   REVIEW OF SYSTEMS:  CONSTITUTIONAL:  He feels weak and tired. Now chest pain free. No fever. EYES: No blurred vision. ENT: No tinnitus. No epistaxis. No difficulty swallowing. RESPIRATORY: Chronic cough. No wheezing. CARDIOVASCULAR: Had chest pain on the left side with near syncope today. GI: Has vomited 2 times in the ER, but denies any nausea now. No abdominal pain. No gastroesophageal reflux disease. GU: Has been on dialysis. ENDOCRINE: The patient has no polyuria or nocturia. INTEGUMENT: No skin rashes. MUSCULOSKELETAL: Has right shoulder pain chronically and was seen by orthopedics during the last admission and was given Lidoderm patch. NEUROLOGIC: No numbness or weakness. PSYCH: No anxiety or insomnia.   PHYSICAL EXAMINATION:  VITAL SIGNS: Temperature 95.7, pulse initially 105, respirations 15 to 18, blood pressure 133/83. Sats 98% on room air.   GENERAL: Alert, awake, oriented.  Not in distress.   HEENT: Head atraumatic, normocephalic. Pupils equal reacting to light.  Extraocular movements are intact.  ENT: The patient has no tympanic membrane congestion. Hearing is intact. NOSE: No turbinate hypertrophy. THROAT: The patient has poor dentition, dry mucosa, no oropharyngeal erythema.   NECK: No JVD. No carotid bruit. No thyroid enlargement. No lymphadenopathy.   RESPIRATORY:  Faint wheezing in the lower lobes but other than that clear to auscultation. No distress. Not using accessory muscles of respiration.    CARDIOVASCULAR: S1, S2, irregularly irregular. Atrial fibrillation. PMI not displaced. Pulses are good in pedal and femoral arteries.  No extremity edema.   ABDOMEN: Soft, nontender, nondistended. Bowel sounds present. No hepatosplenomegaly.   MUSCULOSKELETAL: Strength 5/5 upper and lower extremities. Has pain with range of motion of the right shoulder.   SKIN: No skin rashes.   LYMPH NODES: No lymphadenopathy in cervical or axillary region.   NEUROLOGIC: Cranial nerves II through XII are intact. No dysarthria or aphasia. Sensation intact.  Motor exam is intact.   PSYCHIATRIC: Oriented to time, place, and person.   LABORATORY, DIAGNOSTIC, AND RADIOLOGICAL DATA: CT of the head shows no acute changes. Cerebellum and brainstem have no acute abnormality.  No evidence of ischemia. Mild chronic changes are present reflecting sequelae of chronic small vessel ischemia. WBC 3.1, hemoglobin 10.2, hematocrit 30.5, platelets 133, INR 1.6.  Electrolytes: Sodium 137, potassium 3.2, chloride 98, bicarbonate 30, BUN 14, creatinine 2.91, glucose 75. LFTs within normal limits. Troponin 0.03, INR 1.5. EKG shows atrial fibrillation at 105 beats per minute and ST depressions were more pronounced in leads V4 to V6. Compared to EKG that was done on 06/01/2011, the ST depressions are more pronounced in the lateral leads at this time.   ASSESSMENT AND PLAN:   1. The patient is a 64 year old male patient with chest pain and near syncope. The patient does have some EKG changes, ST depressions more in lateral leads. Initial troponins are negative. We are going to admit him and get serial enzymes. Continue aspirin, beta blockers, and nitro. He is already on Coumadin. INR is not therapeutic, so increase the dose of Coumadin.  Dr. Gwen Pounds is his cardiologist. We will obtain a consult. The patient's echocardiogram in March shows normal LV function, so because of his coronary artery disease and history of CABG with some EKG  changes we are going to ask Dr. Gwen Pounds to see him.  2. Endstage renal disease on hemodialysis. Continue hemodialysis as scheduled. Obtain nephrology consult.  3. History of recent pneumonia with no evidence of pneumonia on x-ray.  Finish one more dose of Levaquin. 4. History of chronic obstructive pulmonary disease with tobacco abuse. Continue Combivent and also Advair.  Counseled against smoking for three minutes.  5. Hypertension. The patient's blood pressure is in an acceptable range. Continue his home medications including felodipine, hydralazine, and also metoprolol.  6. Chronic atrial fibrillation: INR is not therapeutic. We will increase Coumadin. He was taking 5. Increase to 7.5 and monitor PT/INR.  7. History of right shoulder pain due to degenerative joint disease, seen by the orthopedic doctor during the last admission.  Continue Lidoderm patch and Norco.  8. Gastroesophageal reflux disease. Continue PPIs.   TOTAL TIME SPENT ON HISTORY AND PHYSICAL: About 60 minutes.   ____________________________ Katha Hamming, MD sk:bjt D: 06/10/2011 14:40:14 ET T: 06/10/2011 15:04:27 ET JOB#: [id number]  cc: Katha Hamming, MD, <Dictator> Katha Hamming MD ELECTRONICALLY SIGNED 06/16/2011 14:13

## 2014-06-03 NOTE — Discharge Summary (Signed)
PATIENT NAME:  Russell [name], Russell [name] MR#:  [id number] DATE OF BIRTH:  05/01/1950  DATE OF ADMISSION:  06/01/2011 DATE OF DISCHARGE:  06/06/2011  HISTORY: For a detailed note, please take a look at the history and physical done on admission by Dr. Luberta Mutter.    DIAGNOSES AT DISCHARGE:  1. Malignant hypertension, much improved. 2. Pulmonary edema secondary to malignant hypertension, also now resolved. 3. Chronic obstructive pulmonary disease.  4. Pneumonia.  5. End-stage renal disease on hemodialysis.  6. Chronic atrial fibrillation.  7. Right shoulder pain due to chronic degenerative joint disease.   DIET: The patient is being discharged on a low-sodium renal diet.   ACTIVITY: As tolerated.   FOLLOW-UP:  1. Follow-up with Dr. Mosetta Pigeon at dialysis the next 1 to 2 days. The patient also needs to have a PT-INR drawn at dialysis on Monday.  2. The patient is to follow-up with orthopedics at [facility] as the patient was seen by Dr. Gerrit Heck here.   DISCHARGE MEDICATIONS:  1. Aspirin 81 mg daily.  2. Felodipine 10 mg daily.  3. Combivent 1 puff to six hours as needed. 4. Metoprolol tartrate 25 mg b.i.d.  5. Advair 250/50, one puff b.i.d.  6. Nexium 40 mg b.i.d.  7. Amitriptyline 100 mg daily.  8. Nephro-Vite 1 tab daily.  9. Senokot 1 tab daily. 10. Colace 100 mg daily.  11. Neurontin 300 mg t.i.d.  12. Amiodarone 400 mg twice a day. 13. Lidoderm patch to be applied 12 hours, off 12 hours to the right shoulder.  14. Hydralazine 50 mg p.o. t.i.d.  15. Coumadin 6 mg daily.  16. Levaquin 750 mg every other day x5 doses.  17. Norco 5/325, one to two tabs q.6h. p.r.n. pain.   PERTINENT STUDIES: Chest x-ray on admission: Suspected right lower lobe pneumonia with mild cardiomegaly. X-ray of the right shoulder showing an old fracture of the clavicle with nonhealing area. Chronic degenerative joint disease changes. A CT scan of the cervical spine showed severe degenerative changes  extending from the mid to lower cervical spine without evidence of acute osseous abnormalities. A repeat chest x-ray on 04/26 showing slight decrease in right basal opacities.   CONSULTANTS DURING THE HOSPITAL COURSE:  1. Dr. Mosetta Pigeon, nephrology. 2. Dr. Ruthann Cancer, orthopedics.   HOSPITAL COURSE: This is a 64 year old male with medical problems as mentioned above who presented to the hospital on 06/01/2011 secondary to shortness of breath and malignant hypertension.  1. Shortness of breath. The patient's shortness of breath initially was thought to be related to a combination of suspected pneumonia and also volume overload. The patient was noted to have a significantly elevated BNP at 32,000 and chest x-ray findings suggestive of pulmonary edema. The patient underwent dialysis urgently and was dialyzed continuously for the next few days after admission. His shortness of breath since then has significantly improved. The patient was treated empirically with Levaquin for suspected pneumonia. He is currently being discharged on that.  2. Pneumonia. This was initially thought to be related to hospital-acquired and so the patient was empirically given Levaquin and vancomycin, although his blood cultures have been negative. His clinical symptoms have improved. He is afebrile. His white blood cell count has come down. He currently will be discharged on just p.o. Levaquin for now.  3. Malignant hypertension. When the patient presented to the hospital his blood pressure was significantly elevated with systolic blood pressures over 045. After being dialyzed, his blood pressure since then has significantly  improved. He is currently being discharged on his maintenance meds as mentioned above. Hydralazine has been added to his maintenance regimen. His blood pressure has remained stable on that. Further titrations to his antihypertensives can be done as an outpatient by his primary care physician.  4. Elevated  troponin. This was likely thought to be related to malignant hypertension with also end-stage renal disease. He did not have any EKG changes or any complaints of chest pain. He had a recent echocardiogram done in March of this past year which showed ejection fraction of 50%.  5. Chronic atrial fibrillation. The patient was rate controlled on his metoprolol which he will continue. He is on Coumadin. His INR on the day of discharge is 1.6. He will continue his Coumadin and should likely have a PT/INR checked on Monday when he gets dialyzed.  6. Right shoulder pain. The patient, during the hospital course, started to complain of significant right shoulder pain. His x-ray of his shoulder showed some degenerative changes. A CT of his cervical spine also was consistent with severe degenerative joint disease. The patient was seen in consultation with Dr. Gerrit Heckaliff, started on some Lidoderm patches and some Norco which has seemed to have alleviated symptoms. He is being discharged on that with follow-up as an outpatient. He may benefit from a steroid injection which can be done as an outpatient.  7. The patient is a FULL CODE.   DISPOSITION: He is being discharged home.   TIME SPENT: 40 minutes    ____________________________ Rolly PancakeVivek J. Cherlynn KaiserSainani, MD vjs:ap D: 06/06/2011 14:44:54 ET T: 06/07/2011 13:20:24 ET JOB#: [id number]  cc: Rolly PancakeVivek J. Cherlynn KaiserSainani, MD, <Dictator> Mosetta PigeonHarmeet Singh, MD Winn JockJames C. Gerrit Heckaliff, MD [place] SirenVIVEK J Lounell Schumacher MD ELECTRONICALLY SIGNED 06/08/2011 15:44

## 2014-06-03 NOTE — Consult Note (Signed)
PATIENT NAME:  Russell [name], Russell [name] MR#:  [id number] DATE OF BIRTH:  [birth year]  DATE OF CONSULTATION:  06/02/2011  REFERRING PHYSICIAN:  Katha HammingSnehalatha Konidena, MD CONSULTING PHYSICIAN:  Winn JockJames C. Kaizen Ibsen, MD  REASON FOR CONSULTATION: A 64 year old male with right-sided neck and shoulder pain.   HISTORY OF PRESENT ILLNESS: The patient was admitted to the hospital for renal failure, neck and shoulder pain. He reveals a history of intermittent recurrent right-sided neck and shoulder pain. He has some occasional tingling in his fingers, but really does not describe a significant radicular component.   He undergoes renal dialysis, but has the shunt, etc., in the left arm. He has had very remote right shoulder surgery. He said the pain really does not bother him while he is using the shoulder, but at times it does. He has pain with any pressure on his shoulder or when he lies on that side. He denies any recent trauma.    No fevers, chills, or constitutional symptoms.   PAST MEDICAL HISTORY:  1. Renal failure. 2. Coronary artery disease. 3. Renal transplant in 2010, failed after 1-1/2 years. 4. Hypertension. 5. Chronic obstructive pulmonary disease. 6. Atrial fibrillation.   MEDICATIONS: As per SCM, these are reviewed.   PAST SURGICAL HISTORY:  1. Coronary artery bypass grafting.  2. Cataract surgery.   ALLERGIES: Statins.   SOCIAL HISTORY: He smokes 1 pack per day. No alcohol. No drugs. He is disabled.   FAMILY HISTORY: Notable for kidney disease.   REVIEW OF SYSTEMS: Notable for fatigue and weakness. Unremarkable otherwise for fever, chills, or constitutional symptoms, except for shortness of breath. He denies any chest pain. He has no significant lower extremity difficulty. He does have some occasional back pain.   PHYSICAL EXAMINATION:   GENERAL: The patient is alert and oriented, sitting up in the bed without any difficulty, talking on the telephone.  VITALS: Blood pressure 170/90,  pulse 78 and regular, and respirations 20.   CERVICAL SPINE: Moderate muscular guarding. Tenderness in the musculature on the right side. Minimal midline tenderness. Range of motion of the cervical spine is slightly decreased. The muscles are significantly tender.   RIGHT SHOULDER, ELBOW, WRIST, AND HAND: Overall good range of motion. Trace positive Phalen's to percussion. This is about the wrist. There is no muscular or skin atrophy.   LEFT SHOULDER, ELBOW, WRIST, AND HAND: Good range of motion with normal neurovascular examination.   SPINE: Thoracic spine is nontender. Lumbosacral spine reveals mild tenderness at the lumbosacral junction. Pain primarily is with extension. Decreased lateral rotation and bending.   HIPS, KNEES, FEET, AND ANKLES: Good range of motion. Neurovascular examination of the lower extremities is intact. There is no muscular or skin atrophy.   RIGHT UPPER EXTREMITY: Marked tenderness in the right trapezius and over the resected AC joint. Mild subacromial tenderness. Negative drop arm test. Positive impingement test.   X-RAYS: X-rays are reviewed. There are degenerative changes about the right shoulder [facility]. Some degenerative changes are present. There are mild glenohumeral changes.   Cervical spine x-rays show multilevel degenerative change with spondylosis, most notable at C5-C6 with significant complete obliteration of the foramen.   CLINICAL IMPRESSION: Right shoulder pain, combination of muscular and soft tissue, possibly radicular component of the cervical spine. He has some AC joint degenerative changes with remote surgery. I see no evidence of progressive radiculopathy. He does have some numbness and tingling in his fingers and may have some mild underlying carpal tunnel syndrome. He has  no other significant medical issues.   PLAN: Treat this as a local soft tissue issue. He is written for Lidoderm patch.   He may need cervical epidural.    Medication as needed. ____________________________ [place] Gerrit Heck, MD jcc:slb D: 06/02/2011 18:54:00 ET T: 06/03/2011 08:05:19 ET JOB#: [id number]  cc: [place]. Gerrit Heck, MD, <Dictator> [place] Evamarie Raetz MD ELECTRONICALLY SIGNED 06/05/2011 7:01

## 2014-06-03 NOTE — Discharge Summary (Signed)
PATIENT NAME:  Russell [name], Russell [name] MR#:  [id number] DATE OF BIRTH:  1950-04-03  DATE OF ADMISSION:  04/20/2011 DATE OF DISCHARGE:  04/26/2011  DISCHARGE DIAGNOSES:  1. Acute hypoxic respiratory failure secondary to acute diastolic failure. 2. Pulmonary edema.  3. Chronic obstructive pulmonary disease exacerbation.  4. Acute bronchitis.  5. Elevated troponins most likely secondary to demand ischemia secondary to acute respiratory failure. 6. Coronary artery disease with recent coronary artery bypass graft.  7. Hypertension.  8. History of paroxysmal atrial fibrillation, on Coumadin.  9. End-stage renal disease on hemodialysis. 10. Anemia of chronic disease. 11. History of peptic ulcer disease.   CONSULTATIONS:  1. Cardiology, Dr. Gwen Pounds. 2. Nephrology, Dr. Thedore Mins.   HOSPITAL COURSE: A 64 year old male who has end-stage renal disease on hemodialysis, also history of hypertension, smoker and coronary artery disease with coronary artery bypass graft. He presented with chest pain and shortness of breath. He was admitted as acute respiratory failure, possibly related to pneumonia, chest pain with elevated troponins and also acute congestive heart failure. He was started on IV ceftriaxone and Zithromax. At admission his chest x-ray showed that he had findings consistent with congestive heart failure with mild pulmonary interstitial edema. His white count at admission was normal at 5.3. His BNP was 28,204. He also had elevated troponins in the range of troponin of 0.09 with CPK 244, MB 6.9. He was given IV Lasix initially also and was receiving dialysis to take out fluid because of his fluid overload. Along with IV antibiotics he was started on IV steroids, DuoNebs, Advair and Combivent.  For his coronary artery disease and chest pain and hypertension he was continued on aspirin, initially nitroglycerin and then Imdur was added to the regimen. He was on low dose beta blocker 25 mg b.i.d. Cardiology saw  him and thinks that his elevated troponins are most likely secondary to demand ischemia with acute respiratory failure and poor renal clearance. He had a Myoview done on 04/21/2011 which showed that the patient had, ejection fraction of 49% with inferoseptal hypokinesis and moderate fixed inferior defect. He had an echocardiogram done which showed that the patient had moderate concentric left ventricular hypertrophy, LV function is normal, mild to moderate MR and moderate TR. He got some extra hemodialysis also to take out the fluid. He was restarted on Coumadin and was given heparin bridge also. At discharge his INR is around 1.7, close to being therapeutic. Will discharge him on 6 mg of Coumadin and he is going to follow up with Dr. Gwen Pounds for his INR follow up. His hemoglobin remained stable in the range of 10.1. He has anemia of chronic disease. His blood cultures are all negative. His influenza A and B was negative when he came in and his hepatitis B surface antigen was negative. He has also been taking Norvasc and hydralazine at home which I am going to continue. Currently his chest pain has resolved. His shortness of breath has improved. He is on room air right now. His repeat chest x-ray on 03/14 shows that he has small bibasilar pleural effusions and his interstitial edema has improved since admission. Most likely he has some chronic obstructive pulmonary disease also with his long smoking history and he has been on Advair and Combivent. He almost completed the prednisone taper also.   MEDICATIONS: At discharge I reconsulted his medications from dialysis center.  1. Amlodipine 10 mg daily. 2. Aspirin 81 mg daily.  3. Renvela 800 mg t.i.d.  4. Nexium 40  mg once a day. 5. Hydralazine 50 mg p.o. b.i.d.  6. Combivent metered dose inhaler 2 puffs every 4 to 6 hours as needed for wheezing.  7. Percocet 5/325, 1 tab p.o. q.6 hours as needed for pain. He is requesting some Percocet, I will give him only  15 tablets.  8. Advair Diskus 250/50 b.i.d.  9. Amiodarone 400 mg daily.  10. New medication Cefdinir 300 mg p.o. every other day for three doses. He already received IV ceftriaxone and cefdinir in the hospital.  11. Prednisone 20 mg p.o. once daily for one day, then 10 mg p.o. once daily for one day then stop.  12. Change metoprolol to 25 mg 1 tab p.o. b.i.d.  13. Imdur 30 mg daily.  14. Coumadin 6 mg p.o. once daily.   DIET: Low sodium, low cholesterol diet.   CONDITION AT DISCHARGE: He is comfortable. He is sating on room air.   PHYSICAL EXAMINATION: T-max 97.9, heart rate 77, blood pressure 160/70, saturating 94% on room air. Chest is essentially clear. Heart sounds are regular. He is in sinus rhythm, some right. Abdomen soft, nontender. He has a left AV fistula and has a good thrill. He is awake, alert, oriented to time, place, and person.   FOLLOW UP: He should follow up with Dr. Gwen PoundsKowalski later this week to monitor his INR. [facility] weeks.   TIME SPENT WITH DISCHARGE: 60 minutes.   ____________________________ Fredia SorrowAbhinav Edmund Holcomb, MD ag:cms D: 04/26/2011 10:06:10 ET T: 04/27/2011 12:07:21 ET JOB#: [id number]  cc: Fredia SorrowAbhinav Margarete Horace, MD, <Dictator> Lamar BlinksBruce J. Kowalski, MD [facility] Dr. Victorino DecemberJohn Kiser [facility]  Fredia SorrowABHINAV Dove Gresham MD ELECTRONICALLY SIGNED 05/05/2011 15:13

## 2014-06-03 NOTE — Consult Note (Signed)
PATIENT NAME:  Russell FlowersTERSON, Ladd G MR#:  [id number] DATE OF BIRTH:  August 04, 1950  DATE OF CONSULTATION:  04/22/2011  DAILY PROGRESS NOTE:   SUBJECTIVE: The patient tolerated dialysis well without evidence of significant symptoms of cardiac issues and no chest discomfort.   PHYSICAL EXAMINATION: No significant changes in physical exam, with some shortness of breath and crackles. Orders were reviewed and updated. Laboratory results were reviewed. Radiology results were reviewed, and vital signs were reviewed.   ASSESSMENT: Known coronary artery disease, status post previous coronary artery bypass graft surgery, hypertension, hyperlipidemia, valvular heart disease, end-stage renal disease with acute onset of shortness of breath and chest pain, with congestive heart failure, bronchitis, and probable pneumonia. There is minimal elevation of troponin more consistent with current illness and no evidence of myocardial infarction. The patient's congestive heart failure has improved with dialysis. The patient does have pneumonia likely causing current issues. Recent stress test showed fixed inferior perfusion defect, and the patient does have atrial fibrillation which has a controlled ventricular rate. Plan for continued hypertension control with current medical regimen and heart rate control without change. The patient will need anticoagulation for further risk reduction in stroke with atrial fibrillation. Continue treatment of pneumonia and lung disease.   ____________________________ Lamar BlinksBruce J. Kowalski, MD bjk:cbb D: 04/24/2011 08:42:25 ET T: 04/24/2011 11:11:05 ET JOB#: [id number]  cc: Lamar BlinksBruce J. Kowalski, MD, <Dictator> Lamar BlinksBRUCE J KOWALSKI MD ELECTRONICALLY SIGNED 04/28/2011 14:04

## 2014-06-03 NOTE — Op Note (Signed)
PATIENT NAME:  Russell FlowersTERSON, [name] MR#:  [id number] DATE OF BIRTH:  12/11/1950  DATE OF PROCEDURE:  07/09/2011  PREOPERATIVE DIAGNOSES:  1. End-stage renal disease.  2. Hypertension.  3. 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   POSTOPERATIVE DIAGNOSES:  1. End-stage renal disease.  2. Hypertension.  3. 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   PROCEDURES PERFORMED: 1. Ultrasound guidance for vascular access to left radiocephalic AV fistula.  2. Left upper extremity fistulogram and central venogram.  3. Percutaneous transluminal angioplasty of mid forearm cephalic vein for stenosis at a hypertrophied vein valve with 10 mm diameter angioplasty balloon.   SURGEON: Annice NeedyJason S. Dew, M.D.   ANESTHESIA: Local with moderate conscious sedation.   ESTIMATED BLOOD LOSS: Minimal.   FLUOROSCOPY TIME: Approximately two minutes.   CONTRAST:  40 mL.   INDICATION FOR PROCEDURE: This is a 64 year old African American male with a 65 year old AV fistula. There has been an enlarging aneurysm not far from the anastomosis. He is brought in for a fistulogram for further evaluation of this and possible intervention. The risks and benefits were discussed and informed consent was obtained.   DESCRIPTION OF PROCEDURE: The patient was brought to the vascular interventional radiology suite. The left upper extremity was sterilely prepped and draped and a sterile surgical field was created. The cephalic vein was accessed a couple of centimeters beyond the anastomosis within the AV fistula. Under direct ultrasound guidance, the micropuncture needle, micropuncture wire and sheath were placed. Imaging performed through this showed somewhat surprisingly to me a hypertrophied vein valve that was causing approximately 70 to 80% stenosis between the aneurysmal portion of the AV fistula and what appeared to be the venous access site, in a more decompressed area of the fistula. I initially thought this may just be tortuosity so  oblique and caudal projections were performed and this was indeed a stenotic area. The remainder of the fistula was patent with dual outflow through the basilic and cephalic veins in the upper arm. I up-sized to a 6 [place] sheath, placed a Magic torque wire across the lesion, and a 10 mm diameter angioplasty balloon was inflated in this location. A significant waste was taken which resolved with inflation and completion fistulogram following this showed only mild residual stenosis in the 20 to 30% range. At this point, I elected to terminate the procedure. The sheath was removed around a 4-0 Monocryl pursestring suture, pressure was held, and a sterile dressing was placed. The patient tolerated the procedure well and was taken to the recovery room in stable condition.   ____________________________ Annice NeedyJason S. Dew, MD jsd:slb D: 07/09/2011 08:48:14 ET T: 07/09/2011 11:24:44 ET JOB#: [id number]  cc: Annice NeedyJason S. Dew, MD, <Dictator> Annice NeedyJASON S DEW MD ELECTRONICALLY SIGNED 07/13/2011 14:00

## 2014-06-03 NOTE — Consult Note (Signed)
Brief Consult Note: Diagnosis: Right shoulder, neck pain, DDD,DJD, possible radicular component.   Patient was seen by consultant.   Consult note dictated.   Comments: Lidoderm patch to right shoulder Consider cervical epidural.  Electronic Signatures: Celesta Gentilealiff, Zakkary Thibault C (MD)  (Signed 23-Apr-13 18:13)  Authored: Brief Consult Note   Last Updated: 23-Apr-13 18:13 by Celesta Gentilealiff, Mickeal Daws C (MD)

## 2014-06-03 NOTE — Discharge Summary (Signed)
PATIENT NAME:  Russell [name], Russell [name] MR#:  [id number] DATE OF BIRTH:  06-13-1950  DATE OF ADMISSION:  06/10/2011 DATE OF DISCHARGE:  06/11/2011  HISTORY: For a detailed note, please see the history and physical done on admission by Dr. Luberta Mutter.   DIAGNOSES AT DISCHARGE:  1. Non-ST-elevation myocardial infarction.  2. Chest pain secondary to the non-ST-elevation myocardial infarction.  3. End-stage renal disease on hemodialysis on Monday, Wednesday, Friday. 4. Chronic atrial fibrillation, on Coumadin. 5. Osteoarthritis. 6. Hypertension. 7. Gastroesophageal reflux disease.   8. Hyperlipidemia.   DIET: The patient is being discharged on a low sodium, low fat, American Diabetic Association diet.   ACTIVITY: As tolerated.   FOLLOW-UP: Dr. Arnoldo Hooker in the next 1 to 2 weeks. The patient is to resume his dialysis on Friday at [facility].   DISCHARGE MEDICATIONS: 1. Aspirin 81 mg daily. 2. Felodipine 10 mg daily.  3. Combivent 1 puff q.6 hours as needed.   4. Metoprolol tartrate 25 mg b.i.d.  5. Advair 250/50, one puff b.i.d.  6. Nexium 40 mg b.i.d.  7. Amitriptyline 100 mg daily.  8. Nephro-Vite 1 tab daily.  9. Senokot 1 tab daily.  10. Colace 100 mg daily as needed.  11. Neurontin 300 mg three times daily.  12. Amiodarone 400 mg b.i.d.  13. Lidoderm patch, on 12 hours, off 12 hours.  14. Norco 1 tab q.6 hours as needed for pain. 15. Coumadin 6 mg daily.  16. Hydralazine 50 mg t.i.d.  17. Pravachol 40 mg daily.   CONSULTANTS DURING THE HOSPITAL COURSE: Dr. Arnoldo Hooker from cardiology.   PERTINENT STUDIES DONE DURING THE HOSPITAL COURSE: CT scan of the head done without contrast showing no acute intracranial abnormality. Chest x-ray done showing pulmonary vascular congestion, but no frank pulmonary edema.   HOSPITAL COURSE: This is a 64 year old male with medical problems as mentioned above who presented to the hospital secondary to chest pain and near  syncope.  1. Non-ST-elevation myocardial infarction. The patient presented to the hospital with chest pain and noted to have elevated troponins, which peaked as high as 2.02. The patient was seen in consultation by Dr. Gwen Pounds from cardiology who wanted to manage this presently medically. The patient currently is chest pain free and hemodynamically stable. He will be maintained on his aspirin, beta blocker, and a statin which was prescribed for him. He is to have a close follow up with cardiology as an outpatient. Dr. Gwen Pounds will decide whether he wants to do a catheterization done as an outpatient.  2. End-stage renal disease on hemodialysis. The patient will resume his dialysis as stated. There were no acute issues related to this.  3. Hypertension. Initially, the patient was somewhat hypotensive, but he is also noncompliant with his medications. For now, we will continue his same regimen including felodipine, metoprolol and hydralazine with close follow-up with cardiology as an outpatient.  4. Chronic atrial fibrillation. The patient remained rate controlled during the hospital course. He will resume his metoprolol and amiodarone for rate control. His INR was subtherapeutic, but he will resume his Coumadin. He likely should have a PT-INR checked in the next 3 to 4 days.  5. Gastroesophageal reflux disease. The patient was maintained on some Protonix but he can resume his Nexium upon discharge.  6. Chronic osteoarthritis. The patient was seen by orthopedics on his previous hospitalization and he was discharged on some Lidoderm patches and Norco. He will continue that and [facility].   CODE STATUS: The patient is a FULL CODE.   TIME SPENT: 40 minutes    ____________________________ Rolly PancakeVivek J. Cherlynn KaiserSainani, MD vjs:ap D: 06/11/2011 16:00:59 ET T: 06/12/2011 11:05:53 ET JOB#: [id number]  cc: Rolly PancakeVivek J. Cherlynn KaiserSainani, MD, <Dictator> Lamar BlinksBruce J. Kowalski, MD Houston SirenVIVEK J Caniyah Murley  MD ELECTRONICALLY SIGNED 06/19/2011 18:48

## 2014-06-03 NOTE — Consult Note (Signed)
PATIENT NAME:  Russell [name], Russell [name] MR#:  [id number] DATE OF BIRTH:  09-21-1950  DATE OF CONSULTATION:  06/10/2011  REFERRING PHYSICIAN:  Katha Hamming, MD CONSULTING PHYSICIAN:  Lamar Blinks, MD  REASON FOR CONSULTATION: Known chronic kidney disease with dialysis, atrial fibrillation, hyperlipidemia, aortic valve disease, and stable angina with chest pain.   CHIEF COMPLAINT: "I have chest pain."   HISTORY OF PRESENT ILLNESS: This is a 64 year old male with chronic kidney disease on dialysis who has had acute onset of chest discomfort at the end of his dialysis. The patient had an EKG showing atrial fibrillation with controlled ventricular rate and nonspecific ST and T wave changes. This may be causing his current symptoms. The patient has had previous cardiovascular disease risk but no evidence of myocardial infarction in the recent months and on appropriate medications for treatment of his coronary artery disease. There is currently no evidence of significant congestive heart failure. The patient's chest pain occurred at late end of the dialysis suspicious for either atrial fibrillation, stress with angina of dialysis, and/or hypertension. The patient has complete relief at this time with no evidence of significant troponin elevation consistent with myocardial infarction. We will continue further evaluation.   REVIEW OF SYSTEMS: The remainder review of systems is negative for vision change, ringing in the ears, hearing loss, cough, congestion, heartburn, nausea, vomiting, diarrhea, bloody stools, stomach pain, extremity pain, leg weakness, cramping of the buttocks, known blood clots, headaches, blackouts, dizzy spells, nosebleeds, congestion, trouble swallowing, frequent urination, urination at night, muscle weakness, numbness, anxiety, depression, skin lesions, or skin rashes.   PAST MEDICAL HISTORY:  1. Chronic kidney disease.  2. Coronary artery disease.  3. Atrial fibrillation.   4. Aortic valve disease.  5. Hyperlipidemia.   FAMILY HISTORY: The patient does have multiple family members with cardiovascular disease.   SOCIAL HISTORY: The patient smokes 1 pack per day. Denies alcohol use.   ALLERGIES: No known drug allergies.   CURRENT MEDICATIONS: As listed.   PHYSICAL EXAMINATION:   VITAL SIGNS: Blood pressure 126/68 bilaterally and heart rate 70 upright and reclining and irregular.   GENERAL: He is a well appearing male in no acute distress.   HEENT: No icterus, thyromegaly, ulcers, hemorrhage, or xanthelasma.   CARDIOVASCULAR: Normal S1 and S2 with a 2 to 3/6 right upper sternal border murmur, nonradiating. Point of maximal impulse is diffuse. Carotid upstroke normal without bruit. Jugular venous pressure is normal.   LUNGS: Lungs have few basilar crackles with normal respirations.   ABDOMEN: Soft and nontender without hepatosplenomegaly or masses. Abdominal aorta is normal size without bruit.   EXTREMITIES: 2+ bilateral pulses in dorsal, pedal, radial, and femoral arteries without lower extremity edema, cyanosis, clubbing, or ulcers.   NEUROLOGIC: He is oriented to time, place, and person with normal mood and affect.   ASSESSMENT: This is a 64 year old male with chronic kidney disease on dialysis, atrial fibrillation, hyperlipidemia, aortic valve disease, and coronary artery disease with unstable symptoms consistent with unstable angina but no current evidence of acute myocardial infarction.   RECOMMENDATIONS:  1. Continue diltiazem as before for heart rate control of atrial fibrillation and possible spontaneous conversion to normal sinus rhythm.  2. Would consider anticoagulation if able if no bleeding complications in the past for further risk reduction in stroke with atrial fibrillation.  3. Serial ECGs and enzymes to evaluate for possible myocardial infarction and further evaluation thereafter. 4. No further cardiac diagnostics are necessary at  this time due to recent  evaluation of echocardiogram. 5. Continue hyperlipidemia treatment for a goal LDL below 70 mg/dL.  6. The patient was counseled on the discontinuation of tobacco use. 7. Begin ambulation and follow for improvements of symptoms thereafter and further disposition in the morning.  ____________________________ Lamar BlinksBruce J. Kowalski, MD bjk:slb D: 06/10/2011 18:41:24 ET T: 06/11/2011 11:14:13 ET JOB#: [id number]  cc: Lamar BlinksBruce J. Kowalski, MD, <Dictator> Lamar BlinksBRUCE J KOWALSKI MD ELECTRONICALLY SIGNED 06/18/2011 13:34

## 2014-06-03 NOTE — H&P (Signed)
PATIENT NAME:  Russell [name], Russell [name] MR#:  [id number] DATE OF BIRTH:  10/01/1950  DATE OF ADMISSION:  06/01/2011  PRIMARY CARE PHYSICIAN: None.  CARDIOLOGIST: Arnoldo HookerBruce Kowalski, MD  [facility] Dr. Ellis SavageKiser  CHIEF COMPLAINT: Trouble breathing.   HISTORY OF PRESENT ILLNESS: The patient is a 64 year old male with end-stage renal disease, on hemodialysis, who had two hours of hemodialysis today and started to have shortness of breath with right shoulder pain and neck pain. He was also lethargic. He was sent in for further evaluation by his nephrologist. The patient's dialysis was stopped because of his shoulder pain and neck pain and trouble breathing and also had some phlegm with productive cough. The patient mainly complains of right arm pain, shoulder pain, and generalized body pains. He also was lethargic at dialysis, but at this time, in the ER, he is awake, alert and oriented. Right shoulder pain and neck pain mainly started around dialysis time today. He does not have chest pain. He has cough and productive phlegm. No fever. He denies any left-sided chest pain. The patient was recently admitted on 03/11 to 04/26/2011 for COPD exacerbation and also CHF exacerbation and was discharged with steroids and antibiotics.   PAST MEDICAL HISTORY:  1. History of coronary artery disease status post coronary [facility].    2. History of ESRD for the last 10 years. 3. 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 4. Hypertension. 5. Chronic obstructive pulmonary disease. 6. Atrial fibrillation.   MEDICATIONS:  1. Norvasc 10 mg p.o. daily.  2. Aspirin 81 mg daily. 3. Renvela 800 mg p.o. three times daily.  4. Nexium 40 mg p.o. daily.  5. Hydralazine 50 mg p.o. twice a day. 6. Combivent 2 puffs every 4 to 6 hours as needed for wheezing. 7. Percocet 5/325 mg every 6 hours p.r.n. for pain.  8. Advair Diskus 250/50 one puff twice a day.   9. Amiodarone 400 mg p.o. daily.  10. The patient also was given recent antibiotics with cefdinir and prednisone, which were stopped.  11. Metoprolol 25 mg p.o. twice a day. 12. Imdur 30 mg p.o. daily.  13. Coumadin 6 mg p.o. daily.   PAST SURGICAL HISTORY:  1. Right eye cataract surgery.  2. Coronary artery bypass graft in 2010.   ALLERGIES: Statins.   FAMILY HISTORY: Mother had chronic kidney disease and died at the age of 65. Father had a gunshot wound at the age of 65.   SOCIAL HISTORY: The patient still smokes about a pack per day. No alcohol. No drugs. Disabled  REVIEW OF SYSTEMS: CONSTITUTIONAL: Fatigue and weakness. No fever. EYES: No blurred vision. No double vision. ENT: No tinnitus. No ear pain. No hearing loss. No epistaxis. No difficulty swallowing. RESPIRATORY: Has cough. No wheezing. No painful respirations. CARDIOVASCULAR: No chest pain. No pedal edema. GASTROINTESTINAL: No nausea. No vomiting. No diarrhea. GENITOURINARY: No dysuria. ENDOCRINE: No polyuria. No  nocturia. INTEGUMENT: No skin rashes. MUSCULOSKELETAL: Complains of right shoulder pain and back pain and neck pain. NEUROLOGIC: No numbness or weakness. PSYCH: No anxiety or insomnia.   PHYSICAL EXAMINATION:   VITALS: Blood pressure 190/93, pulse 76 respirations 20, temperature 98.9, and saturation around 96% on 3 liters. Repeat blood pressure 181/86.   GENERAL: The patient is alert, awake, and oriented, answering questions appropriately.  HEAD: Atraumatic, normocephalic.  EYES:  Pupils are equally AND reacting to light. Extraocular muscles intact.   ENT: No tympanic membrane  congestion. No turbinate hypertrophy. No oropharyngeal erythema.   NECK: Normal range of motion. No JVD. No thyromegaly.   CARDIOVASCULAR: Regular. Systolic ejection murmur present in aortic area.   LUNGS: Bilateral breath sounds with basilar crepitations present.   ABDOMEN: Soft, nontender, and nondistended. Bowel sounds present.    EXTREMITIES: No extremity edema.   SKIN: No skin rashes. Dialysis catheter on the left forearm has good thrill. No signs of infection.   NEUROLOGIC: Oriented to time, place, and person.   PSYCH: Mood and affect are within normal limits.   LABS/STUDIES: Chest x-ray showed cardiomegaly and pulmonary vascular congestion.  CK total 253. CPK-MB 8.5. BNP [id number]. WBC 3.2, hemoglobin 9.7, hematocrit 29.8, and platelets 127. Electrolytes: Sodium 140, potassium 3.7, chloride 105, bicarbonate 25, BUN 31, creatinine 5.67, and glucose 73. Troponin 0.16.  EKG showed normal sinus at 74 beats per minute. No ST-T changes.   ASSESSMENT AND PLAN:  1. The patient is a 64 year old African American male with ESRD who had trouble breathing at his dialysis center with elevated blood pressure. The patient is going to be admitted to the hospitalist service for flash pulmonary edema with malignant hypertension. He needs urgent hemodialysis. He was seen by a nephrologist who already recommended urgent dialysis. So he will be admitted to telemetry and then he will get urgent dialysis.  2. Acute bronchitis. He was recently treated with antibiotics and prednisone. We will continue Advair and Combivent at this time and continue oxygen. 3. Tobacco abuse. The patient was counseled about smoking for approximately three minutes.  4. Thrombocytopenia. The patient is on aspirin and Coumadin. We will monitor the platelets and hold the aspirin at this time.  5. Non-ST-elevation myocardial infarction and elevated troponins likely probably because of demand ischemia with fluid overload. Continue to monitor the troponins. The patient had an echocardiogram during the last visit and was seen by a cardiologist as well. Continue beta blockers and also nitroglycerin and Coumadin at this time, trend troponins. The patient had an echo done during the last visit, on 04/21/2011, that showed an ejection fraction of 49% and had fixed inferoseptal  hypokinesia. So continue to monitor the troponins, continue beta blockers, and Coumadin, also Imdur.  6. The patient is having arthritic pain on the right shoulder and neck pain and is requesting Percocet. We will continue Percocet see the clinical response. I consulted nephrology for urgent hemodialysis.   TOTAL TIME SPENT ON PATIENT CARE: Approximately 60 minutes. ____________________________ Katha Hamming, MD sk:slb D: 06/01/2011 13:44:19 ET T: 06/01/2011 14:14:17 ET JOB#: [id number]  cc: Katha Hamming, MD, <Dictator> Katha Hamming MD ELECTRONICALLY SIGNED 06/06/2011 12:48

## 2014-06-03 NOTE — H&P (Signed)
PATIENT NAME:  Russell [name], Russell [name] MR#:  [id number] DATE OF BIRTH:  11-Feb-1950  DATE OF ADMISSION:  04/20/2011  REFERRING PHYSICIAN: Dr. Buford Dresser    PRIMARY CARE PHYSICIAN: Dr. Ellis Savage   CARDIOLOGIST: Unknown but is a [facility] cardiologist. The patient states he does not remember who the physician was.   REASON FOR ADMISSION: Chest pain, shortness of breath.   HISTORY OF PRESENT ILLNESS: This is a 64 year old African American male with history of CKD stage V for 13 years and hypertension who presents after having dialysis with chest pain, which is left breast,  6 out of 10, no radiation, decreased by the time he came to the hospital. He has not taken any medication. No aspirin. He had no nausea, vomiting, diarrhea, fevers, chills, or shakes. He's had cough for the last several days and shortness of breath. He states he has not had stress test. He says he sees an unknown cardiologist at [facility]. In the ER he was given Lasix and Xopenex. He states his chest pain now has resolved but he is still quite short of breath. We are asked to admit the patient for chest pain and elevated troponin.    PAST MEDICAL HISTORY:  1. CKD, stage V. 2. Hypertension.   PAST SURGICAL HISTORY: Fistula placement, left upper extremity.  MEDICATIONS: He does not remember but on list that he brought from dialysis center:  1. Dexamethasone 96 mg on Fridays. 2. Advair Diskus 250/50 one puff b.i.d.  3. Combivent MDI 1 to 2 puffs q.4 to 6 hours.  4. Percocet 5/325 one p.o. q.4 to 6 hours p.r.n.  5. Nexium 40 mg b.i.d.  6. Amitriptyline 100 mg p.o. at bedtime.  7. Nephro-Vite 1 capsule daily.  8. Claritin 10 mg daily.  9. Senna 8.6 mg 2 p.o. daily.  10. Aspirin 81 mg daily. 11. Colace 100 mg daily.  12. Neurontin 300 mg p.o. t.i.d.  13. Renagel 800 mg one p.o. t.i.d. with meals.  14. Metoprolol tartrate half tablet p.o. b.i.d.  15. Amiodarone 200 mg 2 tablets p.o. b.i.d.  16. Coumadin 4 mg p.o. q.p.m.    DRUG ALLERGIES: Statins, HMG reductase inhibitors.   FAMILY HISTORY: Mother died of CKD at age 15. Father died of gunshot wound at age 48.   SOCIAL HISTORY: He is divorced with four healthy children. Smokes about a pack a day. Does use illicit drugs. He is disabled.   REVIEW OF SYSTEMS: CONSTITUTIONAL: He has fatigue and weakness. No fever. EYES: No blurred vision, double vision, pain, redness, inflammation, glaucoma. ENT: No tinnitus, ear pain, hearing loss, seasonal allergies, epistaxis, discharge. RESPIRATORY: He has cough, wheezing, dyspnea. No painful respiration. CARDIOVASCULAR: Chest pain which has resolved. No edema, arrhythmia. He has dyspnea on exertion. GI: No nausea, vomiting, diarrhea, abdominal pain, hematemesis, melena, gastroesophageal reflux disease. GU: No dysuria, hematuria, renal calculi, incontinence. GU male. No sores, discharge, or prostatitis. ENDOCRINE: No polyuria, nocturia, thyroid problems, increased sweating, heat or cold intolerance. HEME/LYMPH: No anemia, bruising, swollen glands. INTEGUMENTARY: No acne, rash, change in mole, hair or skin. MUSCULOSKELETAL: No pain in back, shoulder, knee, hip, arthritis, swelling, gout. NEUROLOGIC: No numbness, weakness, dysarthria, epilepsy, tremor, vertigo, ataxia. He does have headache. PSYCH: No anxiety, insomnia, ADD, bipolar, depression.   PHYSICAL EXAMINATION:   VITAL SIGNS: Temperature 96.7, heart rate 82, respiratory rate 16, blood pressure 183/83, sating 100% on 3 liters.   GENERAL: The patient is well developed, well nourished in mild respiratory distress, alert and oriented x3.  HEENT: Pupils equal and reactive to light and accommodation. Extraocular movements intact. Anicteric sclerae. No difficulty hearing. Oropharynx clear.   NECK: No JVD. No thyromegaly. No lymphadenopathy. No carotid bruits.   LUNGS: Bibasilar crackles with wheezing throughout both lung fields. Use of accessory muscles. Increased work of  breathing.   BREASTS: No obvious mass.   ABDOMEN: Soft, nontender, nondistended. Positive bowel sounds. No umbilical hernia.   GU: Deferred.   MUSCULOSKELETAL: Strength 5 out of 5. No clubbing, cyanosis, or degenerative joint disease.   SKIN: No rashes, lesions, or induration.   LYMPH: No lymphadenopathy in cervical or supraclavicular area.   NEUROLOGIC: Cranial nerves II through XII intact. Strength 5 out of 5. No dysarthria, aphasia, dysphagia, contractures.   PSYCH: Alert and oriented x3 with good judgment.   LABORATORY, DIAGNOSTIC, AND RADIOLOGICAL DATA: Glucose 72. BNP [id number]. BUN 20, creatinine 4.22, sodium 140, potassium 3.9, chloride 99, sodium 140, potassium 3.9, chloride 99, bicarb 28, anion gap 13, total protein 7.3, albumin 3.5, total bilirubin 0.5, alkaline phosphatase 68, AST 27, ALT 14. CK 232. MB 8.7. Troponin 0.09. White count 5.3, hemoglobin 10.4, hematocrit 31.5, platelets 109. INR 1.0. Chest x-ray shows pulmonary vascular congestion, possible infiltrate on the right side. EKG shows normal sinus rhythm. No ST changes.   ASSESSMENT AND PLAN: This is a 64 year old African American male with history of CKD stage V and hypertension who presents with chest pain, shortness of breath.  1. Chest pain with elevated troponin and elevated CK-MB. Continue Coumadin, however, subtherapeutic so put the patient on heparin nomogram, aspirin, and morphine. Cycle cardiac enzymes. Give p.r.n. morphine and nitroglycerin. [facility] and put the patient on schedule for stress testing as he has not had one lately.  2. CKD stage V. The patient gets hemodialysis Monday, Wednesday, Friday. Will continue this and get Nephrology to see the patient.  3. Respiratory failure, possibly pneumonia. Will give azithromycin, ceftriaxone, nebulizers, and Solu-Medrol. We will also give Lasix b.i.d. IV as there seems to be pulmonary vascular congestion on the EKG with elevated BNP.  4. Anemia  of chronic kidney disease. This is stable.  5. Smoking. Counseled for three minutes. Started nicotine patch.  6. DVT prophylaxis. Maintain with aspirin, Coumadin. Started heparin nomogram as INR was subtherapeutic.   CODE STATUS: FULL CODE.   TOTAL TIME SPENT ON ADMISSION: 55 minutes.   ____________________________ Corie ChiquitoAmir A. Lafayette DragonFirozvi, MD aaf:drc D: 04/20/2011 18:41:13 ET T: 04/21/2011 07:11:23 ET JOB#: [id number]  cc: Karolee OhsAmir A. Lafayette DragonFirozvi, MD, <Dictator> Gwenevere AbbotMargaret A. Kiser, MD [facility]  Karolee OhsAMIR Laverda PageA Phoenyx Paulsen MD ELECTRONICALLY SIGNED 04/21/2011 16:03

## 2014-06-03 NOTE — Op Note (Signed)
PATIENT NAME:  Russell FlowersTERSON, [name] MR#:  [id number] DATE OF BIRTH:  [birth year]  DATE OF PROCEDURE:  03/10/2011  PREOPERATIVE DIAGNOSIS:  Senile cataract right eye.  POSTOPERATIVE DIAGNOSIS:  Senile cataract right eye.  PROCEDURE:  Phacoemulsification with posterior chamber intraocular lens implantation of the right eye.  LENS: ZCBOO 22.5 diopter posterior chamber intraocular lens.  ULTRASOUND TIME:  10% of 57 seconds for CDE 6.1.  SURGEON:  [place]had Jaylyn Iyer, MD  ANESTHESIA:  Topical with tetracaine drops and 2% Xylocaine jelly.  COMPLICATIONS:  None.  DESCRIPTION OF PROCEDURE:  The patient was identified in the holding room and transported to the operating room and placed in the supine position under the operating microscope.  The right eye was identified as the operative eye and it was prepped and draped in the usual sterile ophthalmic fashion.  A 1 millimeter clear-corneal paracentesis was made at the 1:30 position.  The anterior chamber was filled with Viscoat viscoelastic.  A 2.4 millimeter keratome was used to make a near-clear corneal incision at the 10:30 position.  A curvilinear capsulorrhexis was made with a cystotome and capsulorrhexis forceps.  Balanced salt solution was used to hydrodissect and hydrodelineate the nucleus.  Phacoemulsification was then used in horizontal chopping fashion to remove the lens nucleus and epinucleus.  The remaining cortex was then removed using the irrigation and aspiration handpiece. Provisc was then placed into the capsular bag to distend it for lens placement.  A ZCBOO 22.5 diopter lens was then injected into the capsular bag.  The remaining viscoelastic was aspirated.  Wounds were hydrated with balanced salt solution.  The anterior chamber was inflated to a physiologic pressure with balanced salt solution.  Miostat was placed into the anterior chamber to constrict the pupil.  No wound leaks were noted.  Topical Vigamox drops and Maxitrol ointment  were applied to the eye.  The patient was taken to the recovery room in stable condition without complications of anesthesia or surgery.  ____________________________ Deirdre Evenerhadwick R. Jaylnn Ullery, MD crb:slb D: 03/10/2011 10:18:36 ET T: 03/10/2011 10:57:53 ET JOB#: [id number]  cc: Deirdre Evenerhadwick R. Whitnee Orzel, MD, <Dictator> Lockie MolaHADWICK Kayelyn Lemon MD ELECTRONICALLY SIGNED 03/11/2011 11:12
